# Patient Record
Sex: Male | Born: 1960
Health system: Southern US, Community
[De-identification: ages and names within clinical notes are randomized; demographics above are authoritative.]

## PROBLEM LIST (undated history)

## (undated) DIAGNOSIS — I639 Cerebral infarction, unspecified: Secondary | ICD-10-CM

## (undated) DIAGNOSIS — E785 Hyperlipidemia, unspecified: Secondary | ICD-10-CM

## (undated) DIAGNOSIS — K625 Hemorrhage of anus and rectum: Secondary | ICD-10-CM

## (undated) DIAGNOSIS — K449 Diaphragmatic hernia without obstruction or gangrene: Secondary | ICD-10-CM

## (undated) DIAGNOSIS — K2100 Gastro-esophageal reflux disease with esophagitis, without bleeding: Secondary | ICD-10-CM

## (undated) DIAGNOSIS — T7840XA Allergy, unspecified, initial encounter: Secondary | ICD-10-CM

## (undated) DIAGNOSIS — K635 Polyp of colon: Secondary | ICD-10-CM

## (undated) DIAGNOSIS — K219 Gastro-esophageal reflux disease without esophagitis: Secondary | ICD-10-CM

## (undated) DIAGNOSIS — K222 Esophageal obstruction: Secondary | ICD-10-CM

## (undated) HISTORY — PX: UPPER GASTROINTESTINAL ENDOSCOPY: SHX188

## (undated) HISTORY — DX: Polyp of colon: K63.5

## (undated) HISTORY — PX: OTHER SURGICAL HISTORY: SHX169

## (undated) HISTORY — DX: Gastro-esophageal reflux disease with esophagitis, without bleeding: K21.00

## (undated) HISTORY — DX: Gastro-esophageal reflux disease without esophagitis: K21.9

## (undated) HISTORY — PX: LIPOMA EXCISION: SHX5283

## (undated) HISTORY — DX: Hyperlipidemia, unspecified: E78.5

## (undated) HISTORY — DX: Allergy, unspecified, initial encounter: T78.40XA

## (undated) HISTORY — PX: POLYPECTOMY: SHX149

## (undated) HISTORY — DX: Esophageal obstruction: K22.2

## (undated) HISTORY — DX: Diaphragmatic hernia without obstruction or gangrene: K44.9

## (undated) HISTORY — PX: COLONOSCOPY: SHX174

## (undated) HISTORY — DX: Gastro-esophageal reflux disease with esophagitis: K21.0

## (undated) HISTORY — DX: Hemorrhage of anus and rectum: K62.5

---

## 2001-08-01 ENCOUNTER — Encounter (INDEPENDENT_AMBULATORY_CARE_PROVIDER_SITE_OTHER): Payer: Self-pay | Admitting: *Deleted

## 2001-08-01 ENCOUNTER — Ambulatory Visit (HOSPITAL_BASED_OUTPATIENT_CLINIC_OR_DEPARTMENT_OTHER): Admission: RE | Admit: 2001-08-01 | Discharge: 2001-08-01 | Payer: Self-pay | Admitting: General Surgery

## 2004-08-03 ENCOUNTER — Ambulatory Visit: Payer: Self-pay | Admitting: Internal Medicine

## 2005-01-19 ENCOUNTER — Ambulatory Visit: Payer: Self-pay | Admitting: Internal Medicine

## 2005-02-10 ENCOUNTER — Ambulatory Visit: Payer: Self-pay | Admitting: Internal Medicine

## 2005-04-12 ENCOUNTER — Encounter: Payer: Self-pay | Admitting: Internal Medicine

## 2005-07-18 ENCOUNTER — Ambulatory Visit: Payer: Self-pay | Admitting: Internal Medicine

## 2005-07-27 ENCOUNTER — Ambulatory Visit: Payer: Self-pay | Admitting: Internal Medicine

## 2006-06-07 ENCOUNTER — Ambulatory Visit: Payer: Self-pay | Admitting: Family Medicine

## 2007-06-24 ENCOUNTER — Ambulatory Visit: Payer: Self-pay | Admitting: Internal Medicine

## 2007-06-24 DIAGNOSIS — K625 Hemorrhage of anus and rectum: Secondary | ICD-10-CM | POA: Insufficient documentation

## 2007-06-28 ENCOUNTER — Telehealth (INDEPENDENT_AMBULATORY_CARE_PROVIDER_SITE_OTHER): Payer: Self-pay | Admitting: *Deleted

## 2007-07-05 ENCOUNTER — Encounter: Payer: Self-pay | Admitting: Internal Medicine

## 2007-07-30 ENCOUNTER — Encounter: Payer: Self-pay | Admitting: Internal Medicine

## 2007-08-05 ENCOUNTER — Ambulatory Visit: Payer: Self-pay | Admitting: Gastroenterology

## 2007-08-20 ENCOUNTER — Ambulatory Visit: Payer: Self-pay | Admitting: Gastroenterology

## 2007-08-20 ENCOUNTER — Encounter: Payer: Self-pay | Admitting: Internal Medicine

## 2007-08-20 ENCOUNTER — Encounter: Payer: Self-pay | Admitting: Gastroenterology

## 2007-08-20 DIAGNOSIS — D126 Benign neoplasm of colon, unspecified: Secondary | ICD-10-CM | POA: Insufficient documentation

## 2007-08-20 DIAGNOSIS — K449 Diaphragmatic hernia without obstruction or gangrene: Secondary | ICD-10-CM | POA: Insufficient documentation

## 2007-08-20 DIAGNOSIS — K222 Esophageal obstruction: Secondary | ICD-10-CM | POA: Insufficient documentation

## 2007-09-18 ENCOUNTER — Ambulatory Visit: Payer: Self-pay | Admitting: Gastroenterology

## 2007-10-21 ENCOUNTER — Telehealth (INDEPENDENT_AMBULATORY_CARE_PROVIDER_SITE_OTHER): Payer: Self-pay | Admitting: *Deleted

## 2007-10-25 DIAGNOSIS — K219 Gastro-esophageal reflux disease without esophagitis: Secondary | ICD-10-CM | POA: Insufficient documentation

## 2007-10-29 ENCOUNTER — Ambulatory Visit: Payer: Self-pay | Admitting: Internal Medicine

## 2008-07-24 ENCOUNTER — Telehealth (INDEPENDENT_AMBULATORY_CARE_PROVIDER_SITE_OTHER): Payer: Self-pay | Admitting: *Deleted

## 2008-07-31 ENCOUNTER — Encounter (INDEPENDENT_AMBULATORY_CARE_PROVIDER_SITE_OTHER): Payer: Self-pay | Admitting: *Deleted

## 2008-08-14 ENCOUNTER — Encounter: Payer: Self-pay | Admitting: Internal Medicine

## 2008-09-14 ENCOUNTER — Telehealth (INDEPENDENT_AMBULATORY_CARE_PROVIDER_SITE_OTHER): Payer: Self-pay | Admitting: *Deleted

## 2008-09-17 ENCOUNTER — Encounter: Payer: Self-pay | Admitting: Internal Medicine

## 2008-09-17 ENCOUNTER — Telehealth (INDEPENDENT_AMBULATORY_CARE_PROVIDER_SITE_OTHER): Payer: Self-pay | Admitting: *Deleted

## 2009-03-30 ENCOUNTER — Telehealth (INDEPENDENT_AMBULATORY_CARE_PROVIDER_SITE_OTHER): Payer: Self-pay | Admitting: *Deleted

## 2009-03-30 ENCOUNTER — Telehealth: Payer: Self-pay | Admitting: Internal Medicine

## 2009-05-19 ENCOUNTER — Telehealth (INDEPENDENT_AMBULATORY_CARE_PROVIDER_SITE_OTHER): Payer: Self-pay | Admitting: *Deleted

## 2009-11-11 ENCOUNTER — Ambulatory Visit: Payer: Self-pay | Admitting: Internal Medicine

## 2009-11-11 DIAGNOSIS — K589 Irritable bowel syndrome without diarrhea: Secondary | ICD-10-CM | POA: Insufficient documentation

## 2010-01-04 ENCOUNTER — Telehealth: Payer: Self-pay | Admitting: Gastroenterology

## 2010-10-11 NOTE — Progress Notes (Signed)
Summary: Schedule recall REV   Phone Note Outgoing Call Call back at [facility] Phone [phone]   Call placed by: Christie Nottingham CMA Duncan Dull),  January 04, 2010 11:17 AM Call placed to: Patient Summary of Call: Called pt to schedule recall REV. Pt states he cannot schedule at this time and would like to call back.  Initial call taken by: Christie Nottingham CMA Duncan Dull),  January 04, 2010 11:18 AM

## 2010-10-11 NOTE — Assessment & Plan Note (Signed)
Summary: for pain in abd//ph   Vital Signs:  Patient profile:   50 year old male Weight:      154.8 pounds Temp:     98.2 degrees F oral Pulse rate:   72 / minute Resp:     15 per minute BP sitting:   124 / 82  (left arm) Cuff size:   large  Vitals Entered By: Shonna Chock (November 11, 2009 9:53 AM) CC: Abdominal pain/ side pain-? Acid Reflux, patient not good about taking acid reflux med on a regular. Patient has a fullness feeling in his stomach at times. Symptoms off/on x 1 week Comments REVIEWED MED LIST, PATIENT AGREED DOSE AND INSTRUCTION CORRECT    CC:  Abdominal pain/ side pain-? Acid Reflux and patient not good about taking acid reflux med on a regular. Patient has a fullness feeling in his stomach at times. Symptoms off/on x 1 week.  History of Present Illness:  Abdominal Pain      This is a 50 year old man who presents with Abdominal pain X < 7 days .  The patient denies nausea, vomiting, diarrhea, constipation, melena, hematochezia, anorexia, and hematemesis.  The location of the pain is diffuse, mainly R(U&LQ) > L & minimally in center.  The pain is described as intermittent and dull pressure or fullness.  The patient denies the following symptoms: fever, weight loss, dysuria, chest pain, jaundice, and dark urine.  PMH of GERD, not taking PPI.Trigger = stress @ work. Endo & colonoscopy 2008: HH,peptic stricture (S/P dilation) & adenomatous polyps(repeat colonoscopy 2013 as per Dr Ardell Isaacs notes).Some dysphagia with most meals. NSAIDS occasionally for headache. Minimal alcohol; coke 2-3 /week.  Allergies (verified): No Known Drug Allergies  Past History:  Past Medical History: ESOPHAGEAL STRICTURE (ICD-530.3), PMH of  HIATAL HERNIA (ICD-553.3) REFLUX ESOPHAGITIS (ICD-530.11) COLONIC POLYPS, ADENOMATOUS (ICD-211.3) HEMORRHAGE, RECTAL/ANAL (ICD-569.3),PMH of  Past Surgical History:  Colon polypectomy; Esophageal dilation 08/2007  Review of Systems General:  Denies  chills and sweats. ENT:  Complains of difficulty swallowing; denies hoarseness. Psych:  Complains of anxiety; denies easily angered, easily tearful, and irritability.  Physical Exam  General:  Thin but well-nourished,in no acute distress; alert,appropriate and cooperative throughout examination Eyes:  No corneal or conjunctival inflammation noted. No icterus. MINIMAL ptosis OD.Perrla. Mouth:  Oral mucosa and oropharynx without lesions or exudates.  Teeth in good repair. No pharyngeal erythema.   Lungs:  Normal respiratory effort, chest expands symmetrically. Lungs are clear to auscultation, no crackles or wheezes. Heart:  regular rhythm, no murmur, no gallop, no rub, no JVD, no HJR, and bradycardia.  S4 Abdomen:  Bowel sounds positive,abdomen soft and non-tender without masses, organomegaly or hernias noted. Skin:  Intact without suspicious lesions or rashes. No jaundice Cervical Nodes:  No lymphadenopathy noted Axillary Nodes:  No palpable lymphadenopathy Psych:  memory intact for recent and remote, normally interactive, and good eye contact.     Impression & Recommendations:  Problem # 1:  IBS (ICD-564.1)  Problem # 2:  GASTROESOPHAGEAL REFLUX DISEASE (ICD-530.81)  His updated medication list for this problem includes:    Clidinium-chlordiazepoxide 2.5-5 Mg Caps (Clidinium-chlordiazepoxide) .Marland Kitchen... 1 q 6-8 hrs as needed abdominal pressure  Problem # 3:  ESOPHAGEAL STRICTURE (ICD-530.3) PMH of  Problem # 4:  COLONIC POLYPS, ADENOMATOUS (ICD-211.3) Colonoscopy 2013  Complete Medication List: 1)  Cialis 20 Mg Tabs (Tadalafil) 2)  Benadryl  3)  Prilosec  .Marland Kitchen.. 1 qd 4)  Clidinium-chlordiazepoxide 2.5-5 Mg Caps (Clidinium-chlordiazepoxide) .Marland Kitchen.. 1 q  6-8 hrs as needed abdominal pressure  Patient Instructions: 1)  Complete stool cards. 2)  Avoid foods high in acid (tomatoes, citrus juices, spicy foods). Avoid eating within two hours of lying down or before exercising. Do not over eat;  try smaller more frequent meals. Elevate head of bed twelve inches when sleeping. Omeprazole 20 mg two times a day X 1 month then once daily as maintenance Prescriptions: CLIDINIUM-CHLORDIAZEPOXIDE 2.5-5 MG CAPS (CLIDINIUM-CHLORDIAZEPOXIDE) 1 q 6-8 hrs as needed abdominal pressure  #30 x 1   Entered and Authorized by:   Marga Melnick MD   Signed by:   Marga Melnick MD on 11/11/2009   Method used:   Print then Give to Patient   RxID:   [id number]

## 2011-01-09 ENCOUNTER — Telehealth: Payer: Self-pay | Admitting: *Deleted

## 2011-01-09 NOTE — Telephone Encounter (Signed)
Pt wife called says pt is experiencing hemorrhoids and was given rx for proctofoam in the past would like to have medication called to [facility] [address]

## 2011-01-09 NOTE — Telephone Encounter (Signed)
OK 

## 2011-01-10 MED ORDER — HYDROCORTISONE ACE-PRAMOXINE 1-1 % RE FOAM: 1.0000 | Freq: Two times a day (BID) | RECTAL | Status: AC

## 2011-01-10 NOTE — Telephone Encounter (Signed)
Left msg on voice mail rx sent to pharmacy

## 2011-01-24 NOTE — Assessment & Plan Note (Signed)
[facility]                         GASTROENTEROLOGY OFFICE NOTE   Jeffrey [name], Jeffrey [name]                     MRN:          [id number]  DATE:08/05/2007                            DOB:          04/10/1961    REASON FOR CONSULTATION:  Hematochezia and dysphagia.   HISTORY OF PRESENT ILLNESS:  Jeffrey [name] is a 50 year old white male  referred through the courtesy of Dr. Marga Melnick.  He relates  intermittent problems with small volume rectal bleeding associated with  bowel movements and wiping for several years.  He states he has been  diagnosed with hemorrhoids and a fissure in the past.  He has had no  change in bowel habits, change in stool caliber, constipation, diarrhea,  abdominal pain, or rectal pain.  In addition, he relates a history of  solid food dysphagia for many years.  He states he had a food impaction  and underwent an upper endoscopy by Dr. Dorena Cookey in 2000, as an  emergency.  He states he has had a difficult time swallowing solid foods  for many years.  He has problems with meats and rice.  He has dysphagia  approximately every 2 weeks.  He denies odynophagia, heartburn, and  weight loss.   FAMILY HISTORY:  Negative for colon cancer, colon polyps, or  inflammatory bowel disease.   PAST MEDICAL HISTORY:  1. Allergic rhinitis.  2. History of cellulitis.  3. Hemorrhoids.   CURRENT MEDICATIONS:  1. Benadryl p.r.n.  2. Cialis 20 mg p.r.n.   MEDICATION ALLERGIES:  None known.   SOCIAL HISTORY:  Per the handwritten form.   REVIEW OF SYSTEMS:  Per the handwritten form.   PHYSICAL EXAMINATION:  GENERAL:  Well-developed, well-nourished, white  male in no acute distress.  VITAL SIGNS:  Height 5 feet 8.5 inches, weight 158.8 pounds, blood  pressure is 100/62, pulse 68 and regular.  HEENT:  Anicteric sclerae.  Oropharynx clear.  CHEST:  Clear to auscultation bilaterally.  CARDIAC:  Regular rate and rhythm without murmurs  appreciated.  ABDOMEN:  Soft, nontender, nondistended.  Normoactive bowel sounds.  No  palpable organomegaly, masses, or hernias.  RECTAL:  Deferred to time of colonoscopy.  EXTREMITIES:  Without clubbing, cyanosis, or edema.  NEUROLOGIC:  Alert and oriented x3.  Grossly nonfocal.   ASSESSMENT/PLAN:  1. Intermittent small volume hematochezia, presumed benign anorectal      source such as a hemorrhoid.  Need to exclude colorectal neoplasm      and other disorders.  Risks, benefits, and alternatives to      colonoscopy with possible biopsy, possible polypectomy, and      possible destruction of internal hemorrhoids discussed with the      patient and he consents to proceed.  This will be scheduled      electively.  2. Solid food dysphagia.  I suspect he has an esophageal stricture.      Risks, benefits, and alternatives to upper endoscopy with possible      biopsy and possible dilation discussed with the patient and he      consents to proceed.  This will be  scheduled at the time of his      colonoscopy.     Venita Lick. Russella Dar, MD, [facility]  Electronically Signed    MTS/MedQ  DD: 08/05/2007  DT: 08/05/2007  Job #: [id number]   cc:   Titus Dubin. Alwyn Ren, MD,FACP,FCCP

## 2011-01-24 NOTE — Assessment & Plan Note (Signed)
[facility]                         GASTROENTEROLOGY OFFICE NOTE   TAREZ, BOWNS                     MRN:          [id number]  DATE:09/18/2007                            DOB:          [birth year]    RETURN OFFICE VISIT   This is a return office visit for GERD complicated by a peptic  stricture.  He has had complete resolution of dysphagia following  dilation.  He relates no recurrent rectal bleeding after treatment of  his hemorrhoids.  Two small adenomatous polyps were removed during  colonoscopy.  He has no gastrointestinal complaints.   CURRENT MEDICATIONS:  1. Omeprazole 40 mg p.o. q.a.m.  2. Benadryl p.r.n.   MEDICATION ALLERGIES:  None known.   PHYSICAL EXAMINATION:  No acute distress.  Weight 167 pounds, blood  pressure is 108/72, pulse 64 and regular.  CHEST:  Clear to auscultation bilaterally.  CARDIAC:  Regular rhythm without murmurs.  ABDOMEN:  Soft and nontender with normoactive bowel sounds.   ASSESSMENT AND PLAN:  1. Gastroesophageal reflux disease with a peptic stricture and erosive      esophagitis.  Dysphagia, resolved post dilatation.  Maintain a      proton pump inhibitor indefinitely along with antireflux measures.      Return office visit in 1 year.  2. Adenomatous colon polyps.  Recall colonoscopy recommended for      December, 2013.  3. Internal hemorrhoids.  Currently asymptomatic status post injection      sclerosis.     Venita Lick. Russella Dar, MD, [facility]  Electronically Signed    MTS/MedQ  DD: 09/18/2007  DT: 09/18/2007  Job #: [id number]   cc:   Titus Dubin. Alwyn Ren, MD,FACP,FCCP

## 2011-01-27 NOTE — Op Note (Signed)
[facility]  Patient:    Jeffrey [name], Jeffrey [name] Visit Number: [id number] MRN: [id number]          Service Type: Attending:  Angelia Mould. Derrell Lolling, M.D. Dictated by:   Angelia Mould. Derrell Lolling, M.D. Proc. Date: 08/01/01   CC:         Titus Dubin. Alwyn Ren, M.D. [facility]   Operative Report  PREOPERATIVE DIAGNOSIS:  A 2.5 cm diameter soft tissue mass of posterior scalp, suspect lipoma.  POSTOPERATIVE DIAGNOSIS:  A 2.5 cm diameter soft tissue mass of posterior scalp, suspect lipoma.  PROCEDURE:  Excision 2.5 cm soft tissue mass from posterior scalp.  SURGEON:  Angelia Mould. Derrell Lolling, M.D.  OPERATIVE INDICATION:  This is a 50 year old man who has a five-year history of a soft tissue mass of the posterior scalp.  This has been slowly enlarging. It has never been inflamed or tender.  Examination revealed a 2.5 cm soft tissue mass of the posterior scalp above the hairline almost in the midline. This is soft and consistent with a lipoma by texture.  The skin is healthy. He was brought to the operating room electively.  DESCRIPTION OF PROCEDURE:  The patient was brought to the minor procedure room at [facility] day surgery center and placed prone with lots of padding.  We using scissors clipped a little bit of hair around this area.  It was prepped and draped in a sterile fashion.  Xylocaine 1% with epinephrine was used as a local infiltration anesthetic.  A transverse incision was made directly overlying the soft tissue mass.  This incision was about 2.5 cm in diameter. Dissection was carried down through the dermis, and then we sharply dissected a large lipomatous-appearing mass out of the deep subcutaneous tissue.  This went all the way down to the fascia.  Diffuse oozing was controlled with pressure and with electrocautery and Surgicel gauze.  At the completion of the case, there was absolutely no bleeding whatsoever.  Skin was closed with six interrupted sutures of 3-0 nylon.   The wound was cleansed thoroughly and dried and Neosporin ointment placed.  The patient tolerated the procedure well and was taken to the waiting area in excellent condition.  Estimated blood loss was about 15 cc.  Complications:  None.  Sponge, needle, and instrument counts were correct. Dictated by:   Angelia Mould. Derrell Lolling, M.D. Attending:  Angelia Mould. Derrell Lolling, M.D. DD:  08/01/01 TD:  08/01/01 Job: [id number]

## 2011-03-23 ENCOUNTER — Encounter: Payer: Self-pay | Admitting: Internal Medicine

## 2011-03-24 ENCOUNTER — Ambulatory Visit (INDEPENDENT_AMBULATORY_CARE_PROVIDER_SITE_OTHER): Payer: Managed Care, Other (non HMO) | Admitting: Internal Medicine

## 2011-03-24 ENCOUNTER — Encounter: Payer: Self-pay | Admitting: Internal Medicine

## 2011-03-24 VITALS — BP 116/90 | HR 94 | Temp 97.6°F | Wt 150.8 lb

## 2011-03-24 DIAGNOSIS — R0781 Pleurodynia: Secondary | ICD-10-CM

## 2011-03-24 DIAGNOSIS — R079 Chest pain, unspecified: Secondary | ICD-10-CM

## 2011-03-24 DIAGNOSIS — K222 Esophageal obstruction: Secondary | ICD-10-CM

## 2011-03-24 DIAGNOSIS — K219 Gastro-esophageal reflux disease without esophagitis: Secondary | ICD-10-CM

## 2011-03-24 DIAGNOSIS — R1012 Left upper quadrant pain: Secondary | ICD-10-CM

## 2011-03-24 MED ORDER — OMEPRAZOLE 20 MG PO CPDR: 20.0000 mg | DELAYED_RELEASE_CAPSULE | Freq: Two times a day (BID) | ORAL | Status: DC

## 2011-03-24 NOTE — Progress Notes (Signed)
  Subjective:    Patient ID: Jeffrey [name], male    DOB: June 23, 1961, 50 y.o.   MRN: [id number]  HPI ABDOMINAL PAIN: Location: LUQ  Onset: 7/8 or 7/9    Radiation: no  Severity: up to 5 Quality: burning  Duration: constant  Better with: no relievers  Worse with: no exacerbating factors Symptoms Nausea/Vomiting: no  Diarrhea: loose stool  Constipation: no  Melena/BRBPR: no  Anorexia: no  Fever/Chills: no  Dysuria: no  Rash: no  Wt loss: no  EtOH use: no  NSAIDs/ASA: no   BMW:UXLKG polyps X 1, GERD with a past history of esophageal stricture. On one occasion he was treated for dysphagia; apparently a nasogastric tube was used to dislodge the food bolus.  He has not been using his omeprazole on a regular basis.  FH: CAD among paternal members; father HH       Review of Systems  He denies chest pain, palpitations, pleurisy, dyspnea, or hemoptysis.     Objective:   Physical Exam General appearance is one of good health and nourishment w/o distress. Thin & well nourished  Eyes: No conjunctival inflammation or scleral icterus is present.  Oral exam: Dental hygiene is good; lips and gums are healthy appearing.There is mild  oropharyngeal erythema of uvula   Heart:  Normal rate and regular rhythm. S1 and S2 normal without gallop, murmur, click, rub or other extra sounds  . S4   Lungs:Chest clear to auscultation; no wheezes, rhonchi,rales ,or rubs present.No increased work of breathing.  There is no pain to palpation or compression of the thorax   Abdomen: bowel sounds normal, soft and non-tender without masses, organomegaly or hernias noted.  No guarding or rebound   Skin:Warm & dry.  Intact without suspicious lesions or rashes ; no jaundice or tenting  Vascular: All pulses are intact and no bruits.  Extremities: He has no clubbing, cyanosis or edema. Homans sign is negative.  Lymphatic: No lymphadenopathy is noted about the head, neck, axilla, or inguinal areas.               Assessment & Plan:  #1 left upper quadrant pain, constant since 7/8 or 7/09. This is in the context of significant esophageal reflux disease with past history of dysphasia and esophageal stricture.  #2 family history of coronary disease. EKG is normal with no ischemic changes  Plan: Cardiac enzymes will be checked but this clinically would appear to be hiatal hernia or reflux.  He asked to take his protein pump inhibitor twice a day before breakfast and evening meal.

## 2011-03-24 NOTE — Patient Instructions (Signed)
Complete stool cards.The triggers for dyspepsia or "heart burn"  include stress; the "aspirin family" ; alcohol; peppermint; and caffeine (coffee, tea, cola, and chocolate). The aspirin family would include aspirin and the nonsteroidal agents such as ibuprofen &  Naproxen. Tylenol would not cause reflux. If having dyspepsia ; food & dink should be avoided for @ least 2 hors before going to bed.

## 2011-04-04 ENCOUNTER — Telehealth: Payer: Self-pay | Admitting: *Deleted

## 2011-04-14 ENCOUNTER — Telehealth: Payer: Self-pay | Admitting: *Deleted

## 2011-04-14 NOTE — Telephone Encounter (Signed)
Message copied by Leanne Lovely on Fri Apr 14, 2011  2:25 PM ------      Message from: Pecola Lawless      Created: Fri Apr 14, 2011  2:18 PM       Did he have labs ?      ----- Message -----         From: Pecola Lawless, MD         Sent: 04/01/2011   8:47 AM           To: Pecola Lawless, MD            Tests            ----- Message -----         From: Floydene Flock         Sent: 03/31/2011   4:35 PM           To: Pecola Lawless, MD, [facility]            The system shows these labs still need to be collected.  He is not on my log.[facility]land KitchenMar[facility] KitchenSolstas would have received the labs stat..They have nothing.Marland Kitchen He probably did not stop by the lab.      ----- Message -----         From: Pecola Lawless, MD         Sent: 03/30/2011  10:32 AM           To: Floydene Flock            The patient came the results of his cardiac enzymes  & other labs which were done last week

## 2011-04-14 NOTE — Telephone Encounter (Signed)
Left message for pt--- needs lab appt (labs are in the computer)

## 2011-04-17 NOTE — Telephone Encounter (Signed)
Left message on voicemail for patient to return call to schedule lab appointment CBCD,CKMB, Troponin, D-dimer 786.50/789.02

## 2011-04-18 ENCOUNTER — Other Ambulatory Visit (INDEPENDENT_AMBULATORY_CARE_PROVIDER_SITE_OTHER): Payer: Managed Care, Other (non HMO)

## 2011-04-18 DIAGNOSIS — R579 Shock, unspecified: Secondary | ICD-10-CM

## 2011-04-18 DIAGNOSIS — R1012 Left upper quadrant pain: Secondary | ICD-10-CM

## 2011-04-18 LAB — CBC WITH DIFFERENTIAL/PLATELET
Eosinophils Relative: 1.3 % (ref 0.0–5.0)
Lymphocytes Relative: 15.9 % (ref 12.0–46.0)
MCV: 94.4 fl (ref 78.0–100.0)
Monocytes Absolute: 0.4 10*3/uL (ref 0.1–1.0)
Monocytes Relative: 6.2 % (ref 3.0–12.0)
Neutrophils Relative %: 76 % (ref 43.0–77.0)
Platelets: 200 10*3/uL (ref 150.0–400.0)
RBC: 4.99 Mil/uL (ref 4.22–5.81)
WBC: 6.8 10*3/uL (ref 4.5–10.5)

## 2011-04-18 LAB — CREATININE KINASE MB: CK-MB: 1.9 ng/mL (ref 0.3–4.0)

## 2011-04-18 NOTE — Progress Notes (Signed)
Labs only

## 2011-04-19 LAB — TROPONIN I: Troponin I: <0.01 ng/mL (ref <0.06)

## 2011-04-19 NOTE — Telephone Encounter (Signed)
Labs only

## 2011-04-20 LAB — D-DIMER, QUANTITATIVE: D-Dimer, Quant: 0.28 ug/mL-FEU (ref 0.00–0.48)

## 2011-08-23 ENCOUNTER — Other Ambulatory Visit: Payer: Managed Care, Other (non HMO)

## 2011-08-24 ENCOUNTER — Encounter: Payer: Self-pay | Admitting: Internal Medicine

## 2011-08-24 ENCOUNTER — Ambulatory Visit (INDEPENDENT_AMBULATORY_CARE_PROVIDER_SITE_OTHER): Payer: Managed Care, Other (non HMO) | Admitting: Internal Medicine

## 2011-08-24 VITALS — BP 136/80 | HR 92 | Temp 97.8°F | Wt 164.4 lb

## 2011-08-24 DIAGNOSIS — J209 Acute bronchitis, unspecified: Secondary | ICD-10-CM

## 2011-08-24 MED ORDER — AZITHROMYCIN 250 MG PO TABS: ORAL_TABLET | ORAL | Status: DC

## 2011-08-24 MED ORDER — HYDROCODONE-HOMATROPINE 5-1.5 MG/5ML PO SYRP: 5.0000 mL | ORAL_SOLUTION | Freq: Four times a day (QID) | ORAL | Status: AC | PRN

## 2011-08-24 MED ORDER — FLUTICASONE-SALMETEROL 250-50 MCG/DOSE IN AEPB: INHALATION_SPRAY | RESPIRATORY_TRACT | Status: AC

## 2011-08-24 NOTE — Progress Notes (Signed)
  Subjective:    Patient ID: Jeffrey [name], male    DOB: 1961/08/12, 50 y.o.   MRN: [id number]  HPI Respiratory tract infection Onset/symptoms:9 days  ago as chills & then sweats with myalgias & arthralgias Exposures::wife similarly ill before his symptoms Progression of symptoms: cough as of 12/8 Treatments/response:Nyquil, NSAIDS, lozenges, Delsym with minimal benefit Present symptoms: Fever/chills/sweats:no Frontal headache:no Facial pain:no Nasal purulence:no Sore throat:no Dental pain:no Lymphadenopathy:no Wheezing/shortness of breath:no Cough/sputum/hemoptysis:dry cough Pleuritic pain:no Associated extrinsic/allergic symptoms:itchy eyes/ sneezing:no Past medical history: Seasonal allergies: yes/asthma:no Smoking history:no           Review of Systems     Objective:   Physical Exam General appearance is of good health and nourishment; no acute distress or increased work of breathing is present.  No  lymphadenopathy about the head, neck, or axilla noted.   Eyes: No conjunctival inflammation or lid edema is present.   Ears:  External ear exam shows no significant lesions or deformities.  Otoscopic examination reveals clear canals, tympanic membranes are intact bilaterally without bulging, retraction, inflammation or discharge.  Nose:  External nasal examination shows no deformity or inflammation. Nasal mucosa are pink and moist without lesions or exudates. No septal dislocation .No obstruction to airflow.   Oral exam: Dental hygiene is good; lips and gums are healthy appearing.There is minimal  oropharyngeal erythema ; no exudate noted.   Neck:    Supple with full range of motion without pain.   Heart:  Normal rate and regular rhythm. S1 and S2 normal without gallop, murmur, click, rub or other extra sounds.   Lungs: He has inspiratory pops and wheezes generally homogenously.  Low grade breath bronchospasm is suggested. He has a harsh, brassy, nonproductive  cough. No increased work of breathing.    Extremities:  No cyanosis, edema, or clubbing  noted . Spooning  of the nails are noted   Skin: Warm & dry w/o jaundice or tenting.          Assessment & Plan:  #1 bronchitis, acute with reactive airways disease. No past medical history of asthma. Antibiotics, cough suppression, and bronchodilators/inhaled steroid will be recommended.  Chest x-ray will be ordered to rule out "walking pneumonia".  His history suggests that he had a viral illness such as the flu beginning 9 days ago with the subsequent development of the bronchitis with reactive airway findings.

## 2011-08-24 NOTE — Patient Instructions (Signed)
Advair one inhalation every 12 hours; gargle and spit after use Order for x-rays entered into  the computer; these will be performed at [address] across from [facility]. No appointment is necessary.

## 2011-08-25 ENCOUNTER — Ambulatory Visit (HOSPITAL_COMMUNITY)
Admission: RE | Admit: 2011-08-25 | Discharge: 2011-08-25 | Disposition: A | Discharge status: Home / Self Care | Payer: Managed Care, Other (non HMO) | Source: Ambulatory Visit | Attending: Internal Medicine | Admitting: Internal Medicine

## 2011-08-25 ENCOUNTER — Telehealth: Payer: Self-pay | Admitting: Internal Medicine

## 2011-08-25 DIAGNOSIS — J209 Acute bronchitis, unspecified: Secondary | ICD-10-CM

## 2011-08-25 DIAGNOSIS — R059 Cough, unspecified: Secondary | ICD-10-CM | POA: Insufficient documentation

## 2011-08-25 DIAGNOSIS — R0602 Shortness of breath: Secondary | ICD-10-CM | POA: Insufficient documentation

## 2011-08-25 DIAGNOSIS — R05 Cough: Secondary | ICD-10-CM | POA: Insufficient documentation

## 2011-08-25 MED ORDER — AZITHROMYCIN 250 MG PO TABS: ORAL_TABLET | ORAL | Status: AC

## 2011-08-25 NOTE — Telephone Encounter (Signed)
RX sent to pharmacy, left message informing patient

## 2012-07-11 ENCOUNTER — Encounter: Payer: Self-pay | Admitting: Gastroenterology

## 2012-07-26 ENCOUNTER — Encounter: Payer: Self-pay | Admitting: Gastroenterology

## 2012-09-06 ENCOUNTER — Encounter: Payer: Self-pay | Admitting: Gastroenterology

## 2012-09-06 ENCOUNTER — Ambulatory Visit (AMBULATORY_SURGERY_CENTER): Payer: Self-pay | Admitting: *Deleted

## 2012-09-06 VITALS — Ht 68.5 in | Wt 161.6 lb

## 2012-09-06 DIAGNOSIS — Z1211 Encounter for screening for malignant neoplasm of colon: Secondary | ICD-10-CM

## 2012-09-06 MED ORDER — MOVIPREP 100 G PO SOLR: ORAL | Status: DC

## 2012-09-20 ENCOUNTER — Encounter: Payer: Self-pay | Admitting: Gastroenterology

## 2012-09-20 ENCOUNTER — Ambulatory Visit (AMBULATORY_SURGERY_CENTER): Payer: Managed Care, Other (non HMO) | Admitting: Gastroenterology

## 2012-09-20 VITALS — BP 98/66 | HR 55 | Temp 96.6°F | Resp 24 | Ht 68.5 in | Wt 161.0 lb

## 2012-09-20 DIAGNOSIS — D126 Benign neoplasm of colon, unspecified: Secondary | ICD-10-CM

## 2012-09-20 DIAGNOSIS — Z86010 Personal history of colonic polyps: Secondary | ICD-10-CM

## 2012-09-20 DIAGNOSIS — Z1211 Encounter for screening for malignant neoplasm of colon: Secondary | ICD-10-CM

## 2012-09-20 MED ORDER — SODIUM CHLORIDE 0.9 % IV SOLN: 500.0000 mL | INTRAVENOUS | Status: DC

## 2012-09-20 NOTE — Op Note (Signed)
[facility] [address] [place], [address]   COLONOSCOPY PROCEDURE REPORT  PATIENT: Jeffrey [name], Jeffrey [name]  MR#: [id number] BIRTHDATE: 1961/06/21 , 51  yrs. old GENDER: Male ENDOSCOPIST: Meryl Dare, MD, [facility] PROCEDURE DATE:  09/20/2012 PROCEDURE:   Colonoscopy with snare polypectomy and Colonoscopy with biopsy ASA CLASS:   Class II INDICATIONS:Patient's personal history of adenomatous colon polyps, 08/2007. MEDICATIONS: MAC sedation, administered by CRNA and propofol (Diprivan) 350mg  IV DESCRIPTION OF PROCEDURE:   After the risks benefits and alternatives of the procedure were thoroughly explained, informed consent was obtained.  A digital rectal exam revealed no abnormalities of the rectum.   The LB [id number]  endoscope was introduced through the anus and advanced to the cecum, which was identified by both the appendix and ileocecal valve. No adverse events experienced.   The quality of the prep was excellent, using MoviPrep  The instrument was then slowly withdrawn as the colon was fully examined.  COLON FINDINGS: 5 mm non bleeding angiodysplastic lesion was found at the cecum.   Two sessile polyps measuring 3-4 mm in size were found in the descending colon and sigmoid colon.  A polypectomy was performed with a cold forceps.  The resection was complete and the polyp tissue was completely retrieved.   A sessile polyp measuring 4 mm in size was found in the descending colon.  A polypectomy was performed with cold snare.  The resection was complete and the polyp tissue was completely retrieved.   The colon was otherwise normal.  There was no diverticulosis, inflammation, polyps or cancers unless previously stated.  Retroflexed views revealed moderate internal hemorrhoids. The time to cecum=1 minutes 37 seconds.  Withdrawal time=11 minutes 33 seconds.  The scope was withdrawn and the procedure completed.  COMPLICATIONS: There were no  complications.  ENDOSCOPIC IMPRESSION: 1.   5mm angiodysplastic lesion at the cecum 2.  Two sessile polyps measuring 3-4 mm in the descending and sigmoid colon; polypectomy performed with cold forceps 3.   Sessile polyp measuring 3 mm in the descending colon; polypectomy performed with cold snare 4.   Moderate internal hemorrhoids  RECOMMENDATIONS: 1.  Await pathology results 2.  Repeat Colonoscopy in 5 years.  eSigned:  Meryl Dare, MD, [facility] 09/20/2012 9:20 AM

## 2012-09-20 NOTE — Patient Instructions (Addendum)
YOU HAD AN ENDOSCOPIC PROCEDURE TODAY AT THE [facility]: Refer to the procedure report that was given to you for any specific questions about what was found during the examination.  If the procedure report does not answer your questions, please call your gastroenterologist to clarify.  If you requested that your care partner not be given the details of your procedure findings, then the procedure report has been included in a sealed envelope for you to review at your convenience later.  YOU SHOULD EXPECT: Some feelings of bloating in the abdomen. Passage of more gas than usual.  Walking can help get rid of the air that was put into your GI tract during the procedure and reduce the bloating. If you had a lower endoscopy (such as a colonoscopy or flexible sigmoidoscopy) you may notice spotting of blood in your stool or on the toilet paper. If you underwent a bowel prep for your procedure, then you may not have a normal bowel movement for a few days.  DIET: Your first meal following the procedure should be a light meal and then it is ok to progress to your normal diet.  A half-sandwich or bowl of soup is an example of a good first meal.  Heavy or fried foods are harder to digest and may make you feel nauseous or bloated.  Likewise meals heavy in dairy and vegetables can cause extra gas to form and this can also increase the bloating.  Drink plenty of fluids but you should avoid alcoholic beverages for 24 hours.  ACTIVITY: Your care partner should take you home directly after the procedure.  You should plan to take it easy, moving slowly for the rest of the day.  You can resume normal activity the day after the procedure however you should NOT DRIVE or use heavy machinery for 24 hours (because of the sedation medicines used during the test).    SYMPTOMS TO REPORT IMMEDIATELY: A gastroenterologist can be reached at any hour.  During normal business hours, 8:30 AM to 5:00 PM Monday through Friday,  call [phone].  After hours and on weekends, please call the GI answering service at [phone] who will take a message and have the physician on call contact you.   Following lower endoscopy (colonoscopy or flexible sigmoidoscopy):  Excessive amounts of blood in the stool  Significant tenderness or worsening of abdominal pains  Swelling of the abdomen that is new, acute  Fever of 100F or higher  FOLLOW UP: If any biopsies were taken you will be contacted by phone or by letter within the next 1-3 weeks.  Call your gastroenterologist if you have not heard about the biopsies in 3 weeks.  Our staff will call the home number listed on your records the next business day following your procedure to check on you and address any questions or concerns that you may have at that time regarding the information given to you following your procedure. This is a courtesy call and so if there is no answer at the home number and we have not heard from you through the emergency physician on call, we will assume that you have returned to your regular daily activities without incident.  SIGNATURES/CONFIDENTIALITY: You and/or your care partner have signed paperwork which will be entered into your electronic medical record.  These signatures attest to the fact that that the information above on your After Visit Summary has been reviewed and is understood.  Full responsibility of the confidentiality of this   discharge information lies with you and/or your care-partner  Hemorrhoids Hemorrhoids are enlarged (dilated) veins around the rectum. There are 2 types of hemorrhoids, and the type of hemorrhoid is determined by its location. Internal hemorrhoids occur in the veins just inside the rectum.They are usually not painful, but they may bleed.However, they may poke through to the outside and become irritated and painful. External hemorrhoids involve the veins outside the anus and can be felt as a painful swelling  or hard lump near the anus.They are often itchy and may crack and bleed. Sometimes clots will form in the veins. This makes them swollen and painful. These are called thrombosed hemorrhoids. CAUSES Causes of hemorrhoids include:  Pregnancy. This increases the pressure in the hemorrhoidal veins.  Constipation.  Straining to have a bowel movement.  Obesity.  Heavy lifting or other activity that caused you to strain. TREATMENT Most of the time hemorrhoids improve in 1 to 2 weeks. However, if symptoms do not seem to be getting better or if you have a lot of rectal bleeding, your caregiver may perform a procedure to help make the hemorrhoids get smaller or remove them completely.Possible treatments include:  Rubber band ligation. A rubber band is placed at the base of the hemorrhoid to cut off the circulation.  Sclerotherapy. A chemical is injected to shrink the hemorrhoid.  Infrared light therapy. Tools are used to burn the hemorrhoid.  Hemorrhoidectomy. This is surgical removal of the hemorrhoid. HOME CARE INSTRUCTIONS   Increase fiber in your diet. Ask your caregiver about using fiber supplements.  Drink enough water and fluids to keep your urine clear or pale yellow.  Exercise regularly.  Go to the bathroom when you have the urge to have a bowel movement. Do not wait.  Avoid straining to have bowel movements.  Keep the anal area dry and clean.  Only take over-the-counter or prescription medicines for pain, discomfort, or fever as directed by your caregiver. If your hemorrhoids are thrombosed:  Take warm sitz baths for 20 to 30 minutes, 3 to 4 times per day.  If the hemorrhoids are very tender and swollen, place ice packs on the area as tolerated. Using ice packs between sitz baths may be helpful. Fill a plastic bag with ice. Place a towel between the bag of ice and your skin.  Medicated creams and suppositories may be used or applied as directed.  Do not use a  donut-shaped pillow or sit on the toilet for long periods. This increases blood pooling and pain. SEEK MEDICAL CARE IF:   You have increasing pain and swelling that is not controlled with your medicine.  You have uncontrolled bleeding.  You have difficulty or you are unable to have a bowel movement.  You have pain or inflammation outside the area of the hemorrhoids.  You have chills or an oral temperature above 102 F (38.9 C). MAKE SURE YOU:   Understand these instructions.  Will watch your condition.  Will get help right away if you are not doing well or get worse. Document Released: 08/25/2000 Document Revised: 11/20/2011 Document Reviewed: 08/08/2010 [facility] Patient Information 2013 [place], [place].   Handout on polyps

## 2012-09-20 NOTE — Progress Notes (Signed)
Patient did not experience any of the following events: a burn prior to discharge; a fall within the facility; wrong site/side/patient/procedure/implant event; or a hospital transfer or hospital admission upon discharge from the facility. (G8907) Patient did not have preoperative order for IV antibiotic SSI prophylaxis. (G8918)  

## 2012-09-23 ENCOUNTER — Telehealth: Payer: Self-pay | Admitting: *Deleted

## 2012-09-23 NOTE — Telephone Encounter (Signed)
Left message on f/u call 

## 2012-09-24 ENCOUNTER — Encounter: Payer: Self-pay | Admitting: Gastroenterology

## 2012-10-02 ENCOUNTER — Encounter: Payer: Self-pay | Admitting: Internal Medicine

## 2012-10-02 ENCOUNTER — Ambulatory Visit (INDEPENDENT_AMBULATORY_CARE_PROVIDER_SITE_OTHER): Payer: Managed Care, Other (non HMO) | Admitting: Internal Medicine

## 2012-10-02 VITALS — BP 110/78 | HR 81 | Temp 97.9°F | Wt 162.0 lb

## 2012-10-02 DIAGNOSIS — M545 Low back pain, unspecified: Secondary | ICD-10-CM

## 2012-10-02 DIAGNOSIS — K219 Gastro-esophageal reflux disease without esophagitis: Secondary | ICD-10-CM

## 2012-10-02 LAB — POCT URINALYSIS DIPSTICK
Bilirubin, UA: NEGATIVE
Blood, UA: NEGATIVE
Ketones, UA: NEGATIVE
Protein, UA: NEGATIVE
pH, UA: 6

## 2012-10-02 MED ORDER — OMEPRAZOLE 20 MG PO CPDR: DELAYED_RELEASE_CAPSULE | ORAL | Status: DC

## 2012-10-02 MED ORDER — TRAMADOL HCL 50 MG PO TABS
50.0000 mg | ORAL_TABLET | Freq: Four times a day (QID) | ORAL | Status: DC | PRN
Start: 2012-10-02 — End: 2012-11-06

## 2012-10-02 MED ORDER — CYCLOBENZAPRINE HCL 5 MG PO TABS: ORAL_TABLET | ORAL | Status: DC

## 2012-10-02 NOTE — Patient Instructions (Addendum)
The best exercises for the low back include freestyle swimming, stretch aerobics, and yoga.  The triggers for dyspepsia or "heart burn"  include stress; the "aspirin family" ; alcohol; peppermint; and caffeine (coffee, tea, cola, and chocolate). The aspirin family would include aspirin and the nonsteroidal agents such as ibuprofen &  Naproxen. Tylenol would not cause reflux. If having dyspepsia ; food & drink should be avoided for @ least 2 hours before going to bed.   If you activate My Chart; the results can be released to you as soon as they populate from the lab. If you choose not to use this program; the labs have to be reviewed, copied & mailed   causing a delay in getting the results to you.

## 2012-10-02 NOTE — Progress Notes (Signed)
  Subjective:    Patient ID: Jeffrey [name], male    DOB: 1960-12-15, 52 y.o.   MRN: [id number]  HPI BACK PAIN: Onset: 09/27/12 Trigger/injury: no Location: LS  Quality: "shooting spasms"  Worse with: no factors  Better with: squatting  Radiation: no Treatment/response:NSAIDS, Valium of bro-in -law, Excedrin Back & Body w/o benefit. Heat helped temporarily PMH of trauma: no        Review of Systems  Constitutional: no fever, chills, sweats, change in weight  Musculoskeletal:no  muscle cramps or pain; no  joint stiffness, redness, or swelling Skin:no rash, color/temp change Neuro: no leg weakness; incontinence (stool/urine); numbness and tingling Heme:no lymphadenopathy; abnormal bruising or bleeding .  He has run out of his PPI. Has a history of esophageal dilation in 2008. He's had colon polyps removed 2008 & 2014. He has noted some rectal bleeding from hemorrhoids; he denies active dysphagia, severe reflux, or melena.       Objective:   Physical Exam Gen.: Healthy and well-nourished in appearance. Alert, appropriate and cooperative throughout exam.  Eyes: No corneal or conjunctival inflammation noted. No icterus Mouth: Oral mucosa and oropharynx reveal no lesions or exudates. Teeth in good repair. Neck: No deformities, masses, or tenderness noted. Range of motion normal. Lungs: Normal respiratory effort; chest expands symmetrically. Lungs are clear to auscultation without rales, wheezes, or increased work of breathing. Heart: Normal rate and rhythm. Normal S1 and S2. No gallop, click, or rub. S4 w/o murmur. Abdomen: Bowel sounds normal; abdomen soft and nontender. No masses, organomegaly or hernias noted.                                     Musculoskeletal/extremities: No deformity or scoliosis noted of  the thoracic or lumbar spine. No clubbing, cyanosis, edema, or significant extremity  deformity noted. Range of motion normal .Tone & strength  normal.Joints normal. Nail  health good. Able to lie down & sit up w/o help; BUT he exhibits the classic "low back crawl" when reclining on  and sitting up from  the examining table . Negative SLR bilaterally Vascular: Carotid, radial artery, dorsalis pedis and  posterior tibial pulses are full and equal. No bruits present. Neurologic: Alert and oriented x3. Deep tendon reflexes symmetrical and normal. Gait including heel & toe walking normal.        Skin: Intact without suspicious lesions or rashes. Lymph: No cervical, axillary lymphadenopathy present. Psych: Mood and affect are normal. Normally interactive                                                                                          Assessment & Plan:  #1 acute LBP #2 GERD Plan: See orders and recommendations

## 2012-10-04 ENCOUNTER — Telehealth: Payer: Self-pay | Admitting: Gastroenterology

## 2012-10-04 NOTE — Telephone Encounter (Signed)
Left message for patient to call back  

## 2012-10-08 NOTE — Telephone Encounter (Signed)
I reviewed all the results of the path and all questions are answered.  He will call back for any questions

## 2012-10-08 NOTE — Telephone Encounter (Signed)
Left message for patient to call back  

## 2012-10-19 ENCOUNTER — Emergency Department (HOSPITAL_COMMUNITY): Payer: Managed Care, Other (non HMO)

## 2012-10-19 ENCOUNTER — Emergency Department (HOSPITAL_COMMUNITY)
Admission: EM | Admit: 2012-10-19 | Discharge: 2012-10-19 | Disposition: A | Discharge status: Home / Self Care | Payer: Managed Care, Other (non HMO) | Attending: Emergency Medicine | Admitting: Emergency Medicine

## 2012-10-19 ENCOUNTER — Encounter (HOSPITAL_COMMUNITY): Payer: Self-pay | Admitting: *Deleted

## 2012-10-19 DIAGNOSIS — Z860100 Personal history of colon polyps, unspecified: Secondary | ICD-10-CM | POA: Insufficient documentation

## 2012-10-19 DIAGNOSIS — M545 Low back pain, unspecified: Secondary | ICD-10-CM | POA: Insufficient documentation

## 2012-10-19 DIAGNOSIS — K219 Gastro-esophageal reflux disease without esophagitis: Secondary | ICD-10-CM | POA: Insufficient documentation

## 2012-10-19 DIAGNOSIS — Z8679 Personal history of other diseases of the circulatory system: Secondary | ICD-10-CM | POA: Insufficient documentation

## 2012-10-19 DIAGNOSIS — Z8719 Personal history of other diseases of the digestive system: Secondary | ICD-10-CM | POA: Insufficient documentation

## 2012-10-19 DIAGNOSIS — R109 Unspecified abdominal pain: Secondary | ICD-10-CM

## 2012-10-19 DIAGNOSIS — Z79899 Other long term (current) drug therapy: Secondary | ICD-10-CM | POA: Insufficient documentation

## 2012-10-19 LAB — CBC WITH DIFFERENTIAL/PLATELET
Basophils Absolute: 0.1 10*3/uL (ref 0.0–0.1)
Basophils Relative: 1 % (ref 0–1)
Eosinophils Absolute: 0.5 10*3/uL (ref 0.0–0.7)
Eosinophils Relative: 10 % — ABNORMAL HIGH (ref 0–5)
HCT: 44.1 % (ref 39.0–52.0)
Hemoglobin: 16.2 g/dL (ref 13.0–17.0)
Lymphocytes Relative: 33 % (ref 12–46)
Lymphs Abs: 1.6 10*3/uL (ref 0.7–4.0)
MCH: 32.1 pg (ref 26.0–34.0)
MCHC: 36.7 g/dL — ABNORMAL HIGH (ref 30.0–36.0)
MCV: 87.5 fL (ref 78.0–100.0)
Monocytes Absolute: 0.5 10*3/uL (ref 0.1–1.0)
Monocytes Relative: 10 % (ref 3–12)
Neutro Abs: 2.3 10*3/uL (ref 1.7–7.7)
Neutrophils Relative %: 46 % (ref 43–77)
Platelets: 176 10*3/uL (ref 150–400)
RBC: 5.04 MIL/uL (ref 4.22–5.81)
RDW: 12.4 % (ref 11.5–15.5)
WBC: 5 10*3/uL (ref 4.0–10.5)

## 2012-10-19 LAB — URINALYSIS, ROUTINE W REFLEX MICROSCOPIC
Bilirubin Urine: NEGATIVE
Leukocytes, UA: NEGATIVE
Nitrite: NEGATIVE
Specific Gravity, Urine: 1.03 (ref 1.005–1.030)
Urobilinogen, UA: 1 mg/dL (ref 0.0–1.0)
pH: 6 (ref 5.0–8.0)

## 2012-10-19 LAB — COMPREHENSIVE METABOLIC PANEL
ALT: 15 U/L (ref 0–53)
AST: 19 U/L (ref 0–37)
Albumin: 4.1 g/dL (ref 3.5–5.2)
Alkaline Phosphatase: 64 U/L (ref 39–117)
BUN: 17 mg/dL (ref 6–23)
CO2: 30 mEq/L (ref 19–32)
Calcium: 9.4 mg/dL (ref 8.4–10.5)
Chloride: 102 mEq/L (ref 96–112)
Creatinine, Ser: 1.02 mg/dL (ref 0.50–1.35)
GFR calc Af Amer: >90 mL/min (ref >90)
GFR calc non Af Amer: 84 mL/min — ABNORMAL LOW (ref >90)
Glucose, Bld: 89 mg/dL (ref 70–99)
Potassium: 4.3 mEq/L (ref 3.5–5.1)
Sodium: 139 mEq/L (ref 135–145)
Total Bilirubin: 0.8 mg/dL (ref 0.3–1.2)
Total Protein: 7.3 g/dL (ref 6.0–8.3)

## 2012-10-19 LAB — LIPASE, BLOOD: Lipase: 139 U/L — ABNORMAL HIGH (ref 11–59)

## 2012-10-19 MED ORDER — ONDANSETRON HCL 4 MG PO TABS: 4.0000 mg | ORAL_TABLET | Freq: Four times a day (QID) | ORAL | Status: DC

## 2012-10-19 MED ORDER — HYDROMORPHONE HCL PF 1 MG/ML IJ SOLN
1.0000 mg | Freq: Once | INTRAMUSCULAR | Status: AC
  Administered 2012-10-19: 1 mg via INTRAVENOUS
  Filled 2012-10-19: qty 1

## 2012-10-19 MED ORDER — OXYCODONE-ACETAMINOPHEN 5-325 MG PO TABS: 1.0000 | ORAL_TABLET | Freq: Four times a day (QID) | ORAL | Status: DC | PRN

## 2012-10-19 MED ORDER — ONDANSETRON HCL 4 MG/2ML IJ SOLN
4.0000 mg | Freq: Once | INTRAMUSCULAR | Status: AC
  Administered 2012-10-19: 4 mg via INTRAVENOUS
  Filled 2012-10-19: qty 2

## 2012-10-19 NOTE — ED Provider Notes (Signed)
Medical screening examination/treatment/procedure(s) were performed by non-physician practitioner and as supervising physician I was immediately available for consultation/collaboration.   Ahana Najera, MD 10/19/12 2354 

## 2012-10-19 NOTE — ED Notes (Signed)
Pt returns from ct scan,  

## 2012-10-19 NOTE — ED Provider Notes (Signed)
History     CSN: [id number]  Arrival date & time 10/19/12  1204   First MD Initiated Contact with Patient 10/19/12 1318      Chief Complaint  Patient presents with  . Back Pain  . Groin Pain    (Consider location/radiation/quality/duration/timing/severity/associated sxs/prior treatment) Patient is a 52 y.o. male presenting with back pain and groin pain.  Back Pain Groin Pain   Patient is a 52 year old male with a history of GERD who comes in with a chief complaint of low back/groin pain.  The patient first noticed this pain 2-3 weeks ago of his right lower back. He went to go see his primary care physician who prescribed him pain medications and told him to do stretches.  Over the past two days he's noticed that the pain has intensified and is now radiating into his groin into his right testicle.  He is unable to sleep and has to sit up to alleviate the pain just a little bit.  Denies nausea, vomiting, constipation, diarrhea, hematuria, dysuria, hematochezia or rashes.  The pain is not changed with eating.  Had his first  Colonoscopy at age 73 which showed adenomas that were removed.  Just had a repeat colonoscopy and that showed some adenomas which were removed.   Past Medical History  Diagnosis Date  . Hiatal hernia   . Esophageal stricture   . Reflux esophagitis   . Colonic polyp     adenomatous  . Hemorrhage, anal or rectal   . GERD (gastroesophageal reflux disease)     Past Surgical History  Procedure Laterality Date  . Polyectomy  2008 & 2014  . Esophageal dilation  2008    Family History  Problem Relation Age of Onset  . Colon cancer Neg Hx   . Stomach cancer Neg Hx   . Esophageal cancer Neg Hx   . Rectal cancer Neg Hx     History  Substance Use Topics  . Smoking status: Never Smoker   . Smokeless tobacco: Current User    Types: Snuff  . Alcohol Use: Yes     Comment: occasionally      Review of Systems  Musculoskeletal: Positive for back pain.   All  other systems negative except as documented in the HPI. All pertinent positives and negatives as reviewed in the HPI.  Allergies  Review of patient's allergies indicates no known allergies.  Home Medications   Current Outpatient Rx  Name  Route  Sig  Dispense  Refill  . cyclobenzaprine (FLEXERIL) 5 MG tablet      1-2 qhs prn   10 tablet   0   . DiphenhydrAMINE HCl (BENADRYL ALLERGY PO)                . omeprazole (PRILOSEC) 20 MG capsule      30 minutes before breakfast   90 capsule   3   . traMADol (ULTRAM) 50 MG tablet   Oral   Take 1 tablet (50 mg total) by mouth every 6 (six) hours as needed for pain.   30 tablet   0     BP 125/75  Pulse 68  Temp(Src) 97.3 F (36.3 C) (Oral)  Resp 20  SpO2 97%  Physical Exam  Nursing note and vitals reviewed. Constitutional: He is oriented to person, place, and time. He appears well-developed and well-nourished.  HENT:  Head: Normocephalic and atraumatic.  Mouth/Throat: Oropharynx is clear and moist.  Eyes: Pupils are equal, round, and  reactive to light.  Neck: Normal range of motion. Neck supple.  Cardiovascular: Normal rate, regular rhythm and normal heart sounds.   Pulmonary/Chest: Effort normal and breath sounds normal.  Abdominal: Soft. Bowel sounds are normal. He exhibits no distension and no mass. There is no hepatosplenomegaly. There is tenderness in the right upper quadrant and right lower quadrant. There is positive Murphy's sign. There is no rebound and no guarding.  Neurological: He is alert and oriented to person, place, and time.  Skin: Skin is warm and dry. No rash noted.  Psychiatric: He has a normal mood and affect.    ED Course  Procedures (including critical care time)  Labs Reviewed  URINALYSIS, ROUTINE W REFLEX MICROSCOPIC   Patient has a negative CT scan, but does have an elevated lipase.  We will obtain an ultrasound for further evaluation.  Patient does have some upper abdominal pain  MDM           Carlyle Dolly, PA-C 10/19/12 1545

## 2012-10-19 NOTE — ED Notes (Signed)
Reports being seen last week at pcp for lower back pain and given meds. Yesterday pain became more severe and radiating around to right groin, only relief is to sit upright, denies any urinary symptoms. Ambulatory at triage.

## 2012-10-19 NOTE — ED Provider Notes (Signed)
Patient was handed off to me by [facility]. Patient is having right upper quadrant pain and is awaiting an abdominal ultrasound to rule out a creatinine, liver or gallbladder pathology.   To reevaluating the patient does not have a rigid abdomen.   abdominal ultrasound is negative for any acute abnormality. Will refer patient to GI and give pain and nausea medications.  Pt has been advised of the symptoms that warrant their return to the ED. Patient has voiced understanding and has agreed to follow-up with the PCP or specialist.   Dorthula Matas, PA 10/19/12 [phone]

## 2012-10-20 NOTE — ED Provider Notes (Signed)
Medical screening examination/treatment/procedure(s) were performed by non-physician practitioner and as supervising physician I was immediately available for consultation/collaboration.  Sakeena Teall T Kamica Florance, MD 10/20/12 1522 

## 2012-10-26 ENCOUNTER — Other Ambulatory Visit: Payer: Self-pay

## 2012-11-05 ENCOUNTER — Encounter: Payer: Self-pay | Admitting: Lab

## 2012-11-06 ENCOUNTER — Ambulatory Visit (INDEPENDENT_AMBULATORY_CARE_PROVIDER_SITE_OTHER): Payer: Managed Care, Other (non HMO) | Admitting: Internal Medicine

## 2012-11-06 ENCOUNTER — Encounter: Payer: Self-pay | Admitting: Internal Medicine

## 2012-11-06 VITALS — BP 122/80 | HR 89 | Temp 98.2°F | Wt 157.0 lb

## 2012-11-06 DIAGNOSIS — R52 Pain, unspecified: Secondary | ICD-10-CM

## 2012-11-06 DIAGNOSIS — R109 Unspecified abdominal pain: Secondary | ICD-10-CM

## 2012-11-06 NOTE — Patient Instructions (Addendum)
The best exercises for the low back include freestyle swimming, stretch aerobics, and yoga.Cybex & Nautilus rather than dead weights are better for the back. 

## 2012-11-06 NOTE — Progress Notes (Signed)
  Subjective:    Patient ID: Jeffrey [name], male    DOB: 02-15-1961, 52 y.o.   MRN: [id number]  HPI He was seen in emergency room 10/19/12 for right flank pain which radiated to the right inguinal area.  Hospital records labs EKG x-ray reports reviewed and discussed.   Urinalysis was negative as was CT of the abdomen and pelvis.  Pertinent positives included mild elevation of lipase which prompted evaluation with ultrasound; this was negative  Discharged on  pain medication.  Since discharge course stable with no recurrence.     Review of Systems Constitutional: No fever, chills, significant weight change Gastrointestinal: No heartburn,dysphagia, nausea and vomiting,abdominal pain, change in bowels, anorexia, diarrhea, significant constipation, rectal bleeding, melena or jaundice Genitourinary: No dysuria,hematuria, pyuria, dark urine or flank pain Musculoskeletal: No myalgias or muscle cramping, joint stiffness, joint swelling, joint color change, weakness Dermatologic: No rash, change in color or temperature of skin Neurologic: No tremor, gait disturbance,  numbness or tingling Hematologic/lymphatic: No bruising, lymphadenopathy,or  abnormal clotting      Objective:   Physical Exam  Gen.: Thin but healthy and well-nourished in appearance. Alert, appropriate and cooperative throughout exam.Appears younger than stated age   Neck: No deformities, masses, or tenderness noted. Range of motion normal Lungs: Normal respiratory effort; chest expands symmetrically. Lungs are clear to auscultation  @ bases without rales, wheezes, or increased work of breathing.  Abdomen: Bowel sounds normal; abdomen soft and nontender. No masses, organomegaly or hernias noted.                  Musculoskeletal/extremities: No deformity or scoliosis noted of  the thoracic or lumbar spine.  No clubbing, cyanosis, edema, or significant extremity  deformity noted. Range of motion normal .Tone & strength   Normal. Joints normal . Nail health good. Able to lie down & sit up w/o help. Negative SLR bilaterally Vascular:  dorsalis pedis and  posterior tibial pulses are full and equal.  Neurologic: Alert and oriented x3. Deep tendon reflexes symmetrical and normal.  Gait normal  including heel & toe walking .        Skin: Intact without suspicious lesions or rashes. Lymph: No cervical, axillary lymphadenopathy present. Psych: Mood and affect are normal. Normally interactive                                                                                        Assessment & Plan:  #1 acute right flank pain with radiation to the right inguinal area. Urinalysis and CT of the abdomen and pelvis were negative. He still could have passed a renal calculus. The rapid resolution mitigates against lumbar radiculopathy.  #2 elevated lipase which was a "red herring"  Plan: Back hygiene stressed. He should strain the urine should he have any right flank pain. Chiropractory or physical therapy should be considered for acute flank pain which persists. Additionally MRI if this is protracted in nature.

## 2013-02-10 ENCOUNTER — Emergency Department (HOSPITAL_COMMUNITY)
Admission: EM | Admit: 2013-02-10 | Discharge: 2013-02-10 | Disposition: A | Discharge status: Home / Self Care | Payer: Managed Care, Other (non HMO) | Source: Home / Self Care | Attending: Emergency Medicine | Admitting: Emergency Medicine

## 2013-02-10 ENCOUNTER — Telehealth: Payer: Self-pay

## 2013-02-10 ENCOUNTER — Encounter (HOSPITAL_COMMUNITY): Payer: Self-pay | Admitting: Emergency Medicine

## 2013-02-10 DIAGNOSIS — H9203 Otalgia, bilateral: Secondary | ICD-10-CM

## 2013-02-10 DIAGNOSIS — R071 Chest pain on breathing: Secondary | ICD-10-CM

## 2013-02-10 DIAGNOSIS — R0789 Other chest pain: Secondary | ICD-10-CM

## 2013-02-10 DIAGNOSIS — H9209 Otalgia, unspecified ear: Secondary | ICD-10-CM

## 2013-02-10 DIAGNOSIS — R42 Dizziness and giddiness: Secondary | ICD-10-CM

## 2013-02-10 MED ORDER — CETIRIZINE HCL 10 MG PO CAPS: ORAL_CAPSULE | ORAL | Status: DC

## 2013-02-10 MED ORDER — CYCLOBENZAPRINE HCL 10 MG PO TABS: 10.0000 mg | ORAL_TABLET | Freq: Two times a day (BID) | ORAL | Status: DC | PRN

## 2013-02-10 NOTE — ED Notes (Signed)
Pt c/o intermittent mild chest pain onset 1 week... Pain lasts >1 minute sxs also include: intermittent dizziness when he stands up or lays down Denies at the moment: HA, SOB, HA, blurry vision, edema He is alert and oriented w/no signs of acute distress.

## 2013-02-10 NOTE — ED Provider Notes (Signed)
History     CSN: [id number]  Arrival date & time 02/10/13  1650   First MD Initiated Contact with Patient 02/10/13 1709      Chief Complaint  Patient presents with  . Chest Pain    (Consider location/radiation/quality/duration/timing/severity/associated sxs/prior treatment) HPI Comments: Patient presents urgent care complaining that for about a week he's been having left-sided chest pains that last a time seconds to a couple minutes. Patient points to outer aspects of deltoid area. At times feels that lifting and certain movements exaggerates or exacerbates the pain and at times he feels a discomfort at rest. Patient denies any shortness of breath, nausea, or diaphoresis. He describes that he has called his physician's office and he was recommended to come here or the emergency department to be evaluated. Patient denies any shortness of breath or any chest pain at this moment as he's sitting in the exam table. Patient describes that the last time he felt this discomfort was about 3 hours ago. When he was brushing his teeth he suspects he recalls. Patient also describes that during the course of the week he was feeling some bilateral ear discomfort or itchiness, and suspected that maybe he was developing some type of your infection as he has been having  " allergy-type symptoms", which he describes as, nasal congestion cough runny nose.  Patient also describes that within the last few days he's been noticing that when he moves in his bed or when he moves or lays down he feels a bit of an intermittent sensation of dizziness or vertigo. Patient denies any further symptoms associated such as headaches, ringing in his ears are feeling nauseous.  Patient is a 52 y.o. male presenting with chest pain. The history is provided by the patient.  Chest Pain Pain location:  L chest Pain quality: aching   Pain quality: not burning, not crushing, no pressure and not shooting   Pain radiates to:  Does not  radiate Pain radiates to the back: no   Pain severity:  Mild Timing:  Intermittent Context: lifting, movement and at rest   Context: not breathing and no stress   Relieved by:  Rest Worsened by:  Nothing tried Ineffective treatments:  None tried Associated symptoms: dizziness   Associated symptoms: no altered mental status, no anorexia, no back pain, no cough, no diaphoresis, no dysphagia, no fatigue, no fever, no headache, no lower extremity edema, no nausea, no numbness, no palpitations, no shortness of breath, not vomiting and no weakness   Risk factors: no aortic disease, no diabetes mellitus, no hypertension, no immobilization, not obese, no prior DVT/PE and no surgery     Past Medical History  Diagnosis Date  . Hiatal hernia   . Esophageal stricture   . Reflux esophagitis   . Colonic polyp     adenomatous  . Hemorrhage, anal or rectal   . GERD (gastroesophageal reflux disease)     Past Surgical History  Procedure Laterality Date  . Polyectomy  2008 & 2014  . Esophageal dilation  2008    Family History  Problem Relation Age of Onset  . Colon cancer Neg Hx   . Stomach cancer Neg Hx   . Esophageal cancer Neg Hx   . Rectal cancer Neg Hx     History  Substance Use Topics  . Smoking status: Never Smoker   . Smokeless tobacco: Current User    Types: Snuff  . Alcohol Use: Yes     Comment: occasionally  Review of Systems  Constitutional: Positive for appetite change. Negative for fever, diaphoresis, activity change and fatigue.  HENT: Positive for ear pain and sore throat. Negative for trouble swallowing, neck pain, neck stiffness and voice change.   Respiratory: Negative for cough and shortness of breath.   Cardiovascular: Positive for chest pain. Negative for palpitations and leg swelling.  Gastrointestinal: Negative for nausea, vomiting, rectal pain and anorexia.  Musculoskeletal: Negative for back pain.  Skin: Negative for color change and pallor.    Neurological: Positive for dizziness. Negative for tremors, weakness, light-headedness, numbness and headaches.  Psychiatric/Behavioral: Negative for altered mental status.    Allergies  Review of patient's allergies indicates no known allergies.  Home Medications   Current Outpatient Rx  Name  Route  Sig  Dispense  Refill  . diphenhydrAMINE (BENADRYL) 25 mg capsule   Oral   Take 25 mg by mouth every 6 (six) hours as needed for itching. For allergies         . omeprazole (PRILOSEC) 20 MG capsule   Oral   Take 20 mg by mouth daily. 30 minutes before breakfast           BP 117/86  Pulse 96  Temp(Src) 98.7 F (37.1 C) (Oral)  Resp 16  SpO2 97%  Physical Exam  Nursing note and vitals reviewed. Constitutional: He appears well-developed and well-nourished. No distress.  HENT:  Right Ear: Hearing, tympanic membrane, external ear and ear canal normal.  Left Ear: Hearing, tympanic membrane, external ear and ear canal normal.  Pulmonary/Chest: Effort normal. Chest wall is not dull to percussion. He exhibits tenderness. He exhibits no mass, no bony tenderness, no laceration, no crepitus, no edema, no deformity, no swelling and no retraction.    Abdominal: Soft. He exhibits no distension. There is no tenderness. There is no rebound and no guarding.  Musculoskeletal: He exhibits no tenderness.  Neurological: He is alert.  Skin: No rash noted. No erythema.    ED Course  Procedures (including critical care time)  Labs Reviewed - No data to display No results found.   No diagnosis found.  EKG- normal sinus rhythm ventricular rate of 73 beats per minute. Normal PR intervals QRS duration. No QT prolongation. No ST or T wave inversion or depression to suggest acute ischemia. Her  MDM  Problem #1 Symptoms and exam, normal EKG, were consistent with musculoskeletal chest wall pain- deltoid/pectoralis. Have recommended patient to try a muscle relaxer for 2-3 days. We discussed  in detail what symptoms should warrant further evaluation in the emergency department for a coronary event. Based on his current symptoms and exam, at this point have a low level of suspicion of an acute or subacute event. Instructed patient to followup with primary care Dr. for further evaluation if he continues to feel this intermittent discomfort patient agrees and understands, current treatment plan and follow-up.   Problem #2 Bilateral mild otalgia with intermittent vertigo sensation Patient is normotensive non-orthostatic normal cardiovascular neurological exam. Have recommended patient to take a course of Zyrtec for 2 weeks and to followup with primary care Dr. if symptoms persist.        Jimmie Molly, MD 02/10/13 1746

## 2013-02-10 NOTE — Telephone Encounter (Signed)
I was informed by admin staff to f/u with patient, patient was transferred to [facility] 3 times and unable to get through. Spoke with patient, patient c/o of pain under his left arm x 5-6 days (last episode was 1 hour ago), when pain onsets it last about 1 minute or less. Patient also feels off balance and dizzy at times.   I discussed this over with Dr.Hopper and it was recommended that patient seek medical attention at the ER or Urgent Care. Patient verbalized understanding and indicated he would go to [facility] urgent care on [address].

## 2013-05-09 ENCOUNTER — Telehealth: Payer: Self-pay | Admitting: Internal Medicine

## 2013-05-09 NOTE — Telephone Encounter (Signed)
05/09/2013  Pt came by office, needs refill on Prilosec.  He states he does not use it every day so the 1 month supply last longer for him.  Asked if he needs to come in for visit before he can get a refill.  Please advise.  bw

## 2013-05-09 NOTE — Telephone Encounter (Signed)
Lm @ (10:37am) asking the pt to RTC regarding refill on med.//AB/CMA

## 2013-05-14 NOTE — Telephone Encounter (Signed)
Lm @ (11:10am) asking the pt to RTC regarding refill on med.//AB/CMA

## 2013-05-20 MED ORDER — OMEPRAZOLE 20 MG PO CPDR: 20.0000 mg | DELAYED_RELEASE_CAPSULE | Freq: Every day | ORAL | Status: DC

## 2013-05-20 NOTE — Telephone Encounter (Signed)
Rx sent to the pharmacy by e-script.  LM @ (4:51pm) informing the pt that refill was sent to the pharmacy. //AB/CMA

## 2013-07-17 ENCOUNTER — Other Ambulatory Visit: Payer: Self-pay

## 2014-05-06 ENCOUNTER — Ambulatory Visit (INDEPENDENT_AMBULATORY_CARE_PROVIDER_SITE_OTHER): Payer: Managed Care, Other (non HMO) | Admitting: Internal Medicine

## 2014-05-06 ENCOUNTER — Encounter: Payer: Self-pay | Admitting: Internal Medicine

## 2014-05-06 VITALS — BP 106/80 | HR 57 | Temp 98.0°F | Resp 12 | Wt 152.0 lb

## 2014-05-06 DIAGNOSIS — R590 Localized enlarged lymph nodes: Secondary | ICD-10-CM

## 2014-05-06 DIAGNOSIS — J029 Acute pharyngitis, unspecified: Secondary | ICD-10-CM

## 2014-05-06 DIAGNOSIS — R599 Enlarged lymph nodes, unspecified: Secondary | ICD-10-CM

## 2014-05-06 NOTE — Progress Notes (Signed)
   Subjective:    Patient ID: Jeffrey [name], male    DOB: [birth year], 53 y.o.   MRN: [id number]  HPI    Symptoms began one week ago as sore throat and swelling in the right submandibular area. The sore throat has essentially resolved and the right submandibular lymph node has decreased in size.  He had no other signs of rhinosinusitis. He had minor itchy, watery eyes, sneezing  He does have some extrinsic symptoms which are perennial. These have been aggravated by a pet dog in the home  He's also concerned about his moles and a skin tag of the right posterior thigh.    Review of Systems Frontal headache, facial pain , nasal purulence, dental pain, sore throat , otic pain or otic discharge denied. No fever , chills or sweats.     Objective:   Physical Exam  Pertinent positive findings include: There is a pea size submandibular lymph node at the right mid submandibular area. This is not pathologic. He also has osteomata of the mandible intraorally. He has a benign skin tag of the right posterior thigh. He has scattered nevi which have no malignant characteristics.  General appearance:good health ;well nourished; no acute distress or increased work of breathing is present.  No  lymphadenopathy about the head, neck, or axilla noted otherwise. Eyes: No conjunctival inflammation or lid edema is present. There is no scleral icterus. Ears:  External ear exam shows no significant lesions or deformities.  Otoscopic examination reveals clear canals, tympanic membranes are intact bilaterally without bulging, retraction, inflammation or discharge. Nose:  External nasal examination shows no deformity or inflammation. Nasal mucosa are pink and moist without lesions or exudates. No septal dislocation or deviation.No obstruction to airflow.  Oral exam: Dental hygiene is good; lips and gums are healthy appearing.There is no oropharyngeal erythema or exudate noted.  Neck:  No deformities,  thyromegaly, masses, or tenderness noted.   Supple with full range of motion without pain.  Heart:  Normal rate and regular rhythm. S1 and S2 normal without gallop, murmur, click, rub or other extra sounds.  Lungs:Chest clear to auscultation; no wheezes, rhonchi,rales ,or rubs present.No increased work of breathing.   Abdomen reveals no tenderness, organomegaly, or masses. Extremities:  No cyanosis, edema, or clubbing  noted  Skin: Warm & dry w/o jaundice or tenting.        Assessment & Plan:  #1 nonpathologic submandibular lymph node  #2 sore throat, resolved  #3 benign nevi and skin tag  Plan: See the after visit summary. He will monitor this submandibular lymph node weekly and report any enlargement or tenderness. ENT referral if progressive.

## 2014-05-06 NOTE — Progress Notes (Signed)
Pre visit review using our clinic review tool, if applicable. No additional management support is needed unless otherwise documented below in the visit note. 

## 2014-05-06 NOTE — Patient Instructions (Signed)
Zicam Melts or Zinc lozenges as per package label for sore throat . Complementary options include  vitamin C 2000 mg daily; & Echinacea for 4-7 days. Report persistent or progressive fever; discolored nasal or chest secretions; or frontal headache or facial  pain.  Plain Mucinex (NOT D) for thick secretions ;force NON dairy fluids .   Nasal cleansing in the shower as discussed with lather of mild shampoo.After 10 seconds wash off lather while  exhaling through nostrils. Make sure that all residual soap is removed to prevent irritation.  Flonase OR Nasacort AQ 1 spray in each nostril twice a day as needed. Use the "crossover" technique into opposite nostril spraying toward opposite ear @ 45 degree angle, not straight up into nostril.  Use a Neti pot daily only  as needed for significant sinus congestion; going from open side to congested side . Plain Allegra (NOT D )  160 daily , Loratidine 10 mg , OR Zyrtec 10 mg @ bedtime  as needed for itchy eyes & sneezing.

## 2016-05-08 ENCOUNTER — Ambulatory Visit: Payer: Managed Care, Other (non HMO) | Admitting: Internal Medicine

## 2016-06-29 NOTE — Progress Notes (Signed)
Subjective:    Patient ID: Jeffrey [name], male    DOB: 1961-04-03, 55 y.o.   MRN: [id number]  HPI He is here to establish with a new pcp.   He is here for a physical exam.    GERD:  He is not taking nay medication at this time.  He has mild GERD almost daily. He is a history of an esophageal stricture that was dilated once in 2008.  He has started to notice mild difficulty with swallowing with certain foods only. He is concerned that the stricture may be returning.  Moles:  He would like his moles checked.  He denies any changes.  He uses sunscreen.    LN under chin:  He has a persistent lymph node under his chin. He mentioned it to Dr. Linna Darner in the past. He does not think it has changed much. Sometimes it is smaller sometimes it is larger. He denies any other lumps in his neck or jaw area.    Medications and allergies reviewed with patient and updated if appropriate.  Patient Active Problem List   Diagnosis Date Noted  . IBS 11/11/2009  . GASTROESOPHAGEAL REFLUX DISEASE 10/25/2007  . COLONIC POLYPS, ADENOMATOUS 08/20/2007  . ESOPHAGEAL STRICTURE 08/20/2007  . HIATAL HERNIA 08/20/2007  . HEMORRHAGE, RECTAL/ANAL 06/24/2007    No current outpatient prescriptions on file prior to visit.   No current facility-administered medications on file prior to visit.     Past Medical History:  Diagnosis Date  . Colonic polyp    adenomatous  . Esophageal stricture   . GERD (gastroesophageal reflux disease)   . Hemorrhage, anal or rectal   . Hiatal hernia   . Reflux esophagitis     Past Surgical History:  Procedure Laterality Date  . ESOPHAGEAL DILATION  2008  . polyectomy  2008 & 2014    Social History   Social History  . Marital status: Married    Spouse name: N/A  . Number of children: N/A  . Years of education: N/A   Social History Main Topics  . Smoking status: Never Smoker  . Smokeless tobacco: Current User    Types: Snuff  . Alcohol use Yes     Comment:  occasionally  . Drug use: No  . Sexual activity: Not Asked   Other Topics Concern  . None   Social History Narrative   Umpires softball a lot      No regular exercise    Family History  Problem Relation Age of Onset  . Colon cancer Neg Hx   . Stomach cancer Neg Hx   . Esophageal cancer Neg Hx   . Rectal cancer Neg Hx     Review of Systems  Constitutional: Negative for chills and fever.  HENT: Positive for trouble swallowing (occasional - with certain foods).   Eyes: Negative for visual disturbance.  Respiratory: Negative for cough, shortness of breath and wheezing.   Cardiovascular: Negative for chest pain, palpitations and leg swelling.  Gastrointestinal: Negative for abdominal pain, blood in stool, constipation, diarrhea and nausea.  Genitourinary: Negative for difficulty urinating, dysuria and hematuria.  Musculoskeletal: Positive for arthralgias (left 4th MCP). Negative for back pain.  Neurological: Negative for light-headedness and headaches.  Psychiatric/Behavioral: Negative for dysphoric mood. The patient is not nervous/anxious.        Objective:   Vitals:   06/30/16 1101  BP: 132/88  Pulse: 84  Resp: 16  Temp: 97.8 F (36.6 C)   Filed Weights  06/30/16 1101  Weight: 161 lb (73 kg)   Body mass index is 23.78 kg/m.   Physical Exam Constitutional: He appears well-developed and well-nourished. No distress.  HENT:  Head: Normocephalic and atraumatic.  Right Ear: External ear normal.  Left Ear: External ear normal.  Mouth/Throat: Oropharynx is clear and moist.  Normal ear canals and TM b/l  Eyes: Conjunctivae and EOM are normal.  Neck: Neck supple. No tracheal deviation present. No thyromegaly present.  No carotid bruit  Cardiovascular: Normal rate, regular rhythm, normal heart sounds and intact distal pulses.   No murmur heard. Pulmonary/Chest: Effort normal and breath sounds normal. No respiratory distress. He has no wheezes. He has no rales.    Abdominal: Soft. Bowel sounds are normal. He exhibits no distension. There is no tenderness.  Genitourinary: Normal prostate size without nodules  Musculoskeletal: He exhibits no edema.  Lymphadenopathy:    He has no cervical adenopathy.  Skin: Skin is warm and dry. He is not diaphoretic.  Psychiatric: He has a normal mood and affect. His behavior is normal.         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

## 2016-06-30 ENCOUNTER — Ambulatory Visit (INDEPENDENT_AMBULATORY_CARE_PROVIDER_SITE_OTHER): Payer: Managed Care, Other (non HMO) | Admitting: Internal Medicine

## 2016-06-30 ENCOUNTER — Encounter: Payer: Self-pay | Admitting: Internal Medicine

## 2016-06-30 VITALS — BP 132/88 | HR 84 | Temp 97.8°F | Resp 16 | Ht 69.0 in | Wt 161.0 lb

## 2016-06-30 DIAGNOSIS — Z0000 Encounter for general adult medical examination without abnormal findings: Secondary | ICD-10-CM

## 2016-06-30 DIAGNOSIS — Z1159 Encounter for screening for other viral diseases: Secondary | ICD-10-CM | POA: Diagnosis not present

## 2016-06-30 DIAGNOSIS — Z23 Encounter for immunization: Secondary | ICD-10-CM

## 2016-06-30 DIAGNOSIS — K2100 Gastro-esophageal reflux disease with esophagitis, without bleeding: Secondary | ICD-10-CM

## 2016-06-30 DIAGNOSIS — K222 Esophageal obstruction: Secondary | ICD-10-CM | POA: Diagnosis not present

## 2016-06-30 MED ORDER — OMEPRAZOLE 20 MG PO CPDR: 20.0000 mg | DELAYED_RELEASE_CAPSULE | Freq: Every day | ORAL | 3 refills | Status: DC

## 2016-06-30 NOTE — Patient Instructions (Addendum)
  Test(s) ordered today. Your results will be released to [facility] (or called to you) after review, usually within 72hours after test completion. If any changes need to be made, you will be notified at that same time.  All other Health Maintenance issues reviewed.   All recommended immunizations and age-appropriate screenings are up-to-date or discussed.  Tetanus vaccine administered today.   Medications reviewed and updated.  Changes include restarting the omeprazole 20 mg daily  Your prescription(s) have been submitted to your pharmacy. Please take as directed and contact our office if you believe you are having problem(s) with the medication(s).

## 2016-06-30 NOTE — Progress Notes (Signed)
Pre visit review using our clinic review tool, if applicable. No additional management support is needed unless otherwise documented below in the visit note. 

## 2016-07-01 ENCOUNTER — Encounter: Payer: Self-pay | Admitting: Internal Medicine

## 2016-07-01 NOTE — Assessment & Plan Note (Signed)
Having daily symptoms Not currently taking his medication Restart omeprazole 20 mg daily, which did control his symptoms Discussed consequences of uncontrolled GERD

## 2016-07-01 NOTE — Assessment & Plan Note (Addendum)
He is having some mild difficulty swallowing at times-we will continue to monitor and follow up with GI if symptoms worsen Stressed the importance of taking his GERD medication daily

## 2016-07-03 ENCOUNTER — Other Ambulatory Visit (INDEPENDENT_AMBULATORY_CARE_PROVIDER_SITE_OTHER): Payer: Managed Care, Other (non HMO)

## 2016-07-03 DIAGNOSIS — Z1159 Encounter for screening for other viral diseases: Secondary | ICD-10-CM

## 2016-07-03 DIAGNOSIS — Z0000 Encounter for general adult medical examination without abnormal findings: Secondary | ICD-10-CM | POA: Diagnosis not present

## 2016-07-03 DIAGNOSIS — R7989 Other specified abnormal findings of blood chemistry: Secondary | ICD-10-CM

## 2016-07-03 LAB — COMPREHENSIVE METABOLIC PANEL
ALBUMIN: 4.1 g/dL (ref 3.5–5.2)
ALT: 15 U/L (ref 0–53)
AST: 18 U/L (ref 0–37)
Alkaline Phosphatase: 69 U/L (ref 39–117)
BUN: 14 mg/dL (ref 6–23)
CHLORIDE: 103 meq/L (ref 96–112)
CO2: 31 meq/L (ref 19–32)
Calcium: 9.2 mg/dL (ref 8.4–10.5)
Creatinine, Ser: 1.09 mg/dL (ref 0.40–1.50)
GFR: 74.8 mL/min (ref >60.00)
GLUCOSE: 90 mg/dL (ref 70–99)
POTASSIUM: 3.9 meq/L (ref 3.5–5.1)
SODIUM: 139 meq/L (ref 135–145)
Total Bilirubin: 0.6 mg/dL (ref 0.2–1.2)
Total Protein: 7.1 g/dL (ref 6.0–8.3)

## 2016-07-03 LAB — CBC WITH DIFFERENTIAL/PLATELET
BASOS PCT: 0.7 % (ref 0.0–3.0)
Basophils Absolute: 0 10*3/uL (ref 0.0–0.1)
EOS ABS: 0.4 10*3/uL (ref 0.0–0.7)
EOS PCT: 5.8 % — AB (ref 0.0–5.0)
HCT: 44.6 % (ref 39.0–52.0)
Hemoglobin: 15.8 g/dL (ref 13.0–17.0)
LYMPHS ABS: 1.5 10*3/uL (ref 0.7–4.0)
Lymphocytes Relative: 25.1 % (ref 12.0–46.0)
MCHC: 35.3 g/dL (ref 30.0–36.0)
MCV: 90.8 fl (ref 78.0–100.0)
MONO ABS: 0.5 10*3/uL (ref 0.1–1.0)
Monocytes Relative: 7.7 % (ref 3.0–12.0)
NEUTROS ABS: 3.7 10*3/uL (ref 1.4–7.7)
NEUTROS PCT: 60.7 % (ref 43.0–77.0)
PLATELETS: 225 10*3/uL (ref 150.0–400.0)
RBC: 4.91 Mil/uL (ref 4.22–5.81)
RDW: 13.1 % (ref 11.5–15.5)
WBC: 6.1 10*3/uL (ref 4.0–10.5)

## 2016-07-03 LAB — LIPID PANEL
CHOL/HDL RATIO: 4
CHOLESTEROL: 171 mg/dL (ref 0–200)
HDL: 38.3 mg/dL — ABNORMAL LOW (ref >39.00)
NONHDL: 132.5
Triglycerides: 215 mg/dL — ABNORMAL HIGH (ref 0.0–149.0)
VLDL: 43 mg/dL — AB (ref 0.0–40.0)

## 2016-07-03 LAB — HEPATITIS C ANTIBODY: HCV Ab: NEGATIVE

## 2016-07-03 LAB — LDL CHOLESTEROL, DIRECT: LDL DIRECT: 116 mg/dL

## 2016-07-03 LAB — TSH: TSH: 1.38 u[IU]/mL (ref 0.35–4.50)

## 2016-07-03 LAB — PSA: PSA: 0.4

## 2016-07-04 LAB — PSA, TOTAL AND FREE
PROSTATE SPECIFIC AG, SERUM: 0.4 ng/mL (ref 0.0–4.0)
PSA, Free Pct: 27.5 %
PSA, Free: 0.11 ng/mL

## 2016-07-05 ENCOUNTER — Encounter: Payer: Self-pay | Admitting: Internal Medicine

## 2016-07-07 ENCOUNTER — Encounter: Payer: Self-pay | Admitting: Internal Medicine

## 2016-08-11 ENCOUNTER — Other Ambulatory Visit: Payer: Self-pay | Admitting: Emergency Medicine

## 2016-08-11 MED ORDER — OMEPRAZOLE 20 MG PO CPDR: 20.0000 mg | DELAYED_RELEASE_CAPSULE | Freq: Every day | ORAL | 3 refills | Status: DC

## 2017-06-12 ENCOUNTER — Encounter: Payer: Self-pay | Admitting: Internal Medicine

## 2017-06-12 MED ORDER — SCOPOLAMINE 1 MG/3DAYS TD PT72
1.0000 | MEDICATED_PATCH | TRANSDERMAL | 0 refills | Status: DC
Start: 2017-06-12 — End: 2017-10-09

## 2017-08-31 ENCOUNTER — Encounter: Payer: Self-pay | Admitting: Gastroenterology

## 2017-09-07 ENCOUNTER — Other Ambulatory Visit: Payer: Self-pay | Admitting: Internal Medicine

## 2017-10-09 ENCOUNTER — Other Ambulatory Visit: Payer: Self-pay

## 2017-10-09 ENCOUNTER — Encounter: Payer: Self-pay | Admitting: Gastroenterology

## 2017-10-09 ENCOUNTER — Ambulatory Visit (AMBULATORY_SURGERY_CENTER): Payer: Self-pay

## 2017-10-09 VITALS — Ht 68.5 in | Wt 167.0 lb

## 2017-10-09 DIAGNOSIS — Z86010 Personal history of colonic polyps: Secondary | ICD-10-CM

## 2017-10-09 MED ORDER — NA SULFATE-K SULFATE-MG SULF 17.5-3.13-1.6 GM/177ML PO SOLN: 1.0000 | Freq: Once | ORAL | 0 refills | Status: AC

## 2017-10-09 NOTE — Progress Notes (Signed)
Denies allergies to eggs or soy products. Denies complication of anesthesia or sedation. Denies use of weight loss medication. Denies use of O2.   Emmi instructions declined.  

## 2017-10-23 ENCOUNTER — Encounter: Payer: Self-pay | Admitting: Gastroenterology

## 2017-10-23 ENCOUNTER — Other Ambulatory Visit: Payer: Self-pay

## 2017-10-23 ENCOUNTER — Ambulatory Visit (AMBULATORY_SURGERY_CENTER): Payer: Managed Care, Other (non HMO) | Admitting: Gastroenterology

## 2017-10-23 VITALS — BP 118/70 | HR 79 | Temp 97.5°F | Resp 16 | Ht 68.0 in | Wt 167.0 lb

## 2017-10-23 DIAGNOSIS — Z1211 Encounter for screening for malignant neoplasm of colon: Secondary | ICD-10-CM | POA: Insufficient documentation

## 2017-10-23 DIAGNOSIS — Z86010 Personal history of colonic polyps: Secondary | ICD-10-CM

## 2017-10-23 DIAGNOSIS — D124 Benign neoplasm of descending colon: Secondary | ICD-10-CM

## 2017-10-23 MED ORDER — SODIUM CHLORIDE 0.9 % IV SOLN: 500.0000 mL | Freq: Once | INTRAVENOUS | Status: DC

## 2017-10-23 NOTE — Progress Notes (Signed)
Report to PACU, RN, vss, BBS= Clear.  

## 2017-10-23 NOTE — Op Note (Signed)
[facility] Patient Name: Jeffrey [name] Procedure Date: 10/23/2017 10:03 AM MRN: [id number] Endoscopist: Ladene Artist , MD Age: 57 Referring MD:  Date of Birth: [birth year] Gender: Male Account #: [id number] Procedure:                Colonoscopy Indications:              Surveillance: Personal history of adenomatous                            polyps on last colonoscopy 5 years ago Medicines:                Monitored Anesthesia Care Procedure:                Pre-Anesthesia Assessment:                           - Prior to the procedure, a History and Physical                            was performed, and patient medications and                            allergies were reviewed. The patient's tolerance of                            previous anesthesia was also reviewed. The risks                            and benefits of the procedure and the sedation                            options and risks were discussed with the patient.                            All questions were answered, and informed consent                            was obtained. Prior Anticoagulants: The patient has                            taken no previous anticoagulant or antiplatelet                            agents. ASA Grade Assessment: II - A patient with                            mild systemic disease. After reviewing the risks                            and benefits, the patient was deemed in                            satisfactory condition to undergo the procedure.  After obtaining informed consent, the colonoscope                            was passed under direct vision. Throughout the                            procedure, the patient's blood pressure, pulse, and                            oxygen saturations were monitored continuously. The                            Colonoscope was introduced through the anus and                            advanced to the the  cecum, identified by                            appendiceal orifice and ileocecal valve. The                            ileocecal valve, appendiceal orifice, and rectum                            were photographed. The quality of the bowel                            preparation was excellent. The colonoscopy was                            performed without difficulty. The patient tolerated                            the procedure well. Scope In: 10:15:57 AM Scope Out: 10:28:03 AM Scope Withdrawal Time: 0 hours 10 minutes 17 seconds  Total Procedure Duration: 0 hours 12 minutes 6 seconds  Findings:                 The perianal and digital rectal examinations were                            normal.                           A single medium-sized localized angiodysplastic                            lesion without bleeding was found in the cecum.                           A 8 mm polyp was found in the descending colon. The                            polyp was sessile. The polyp was removed with a  cold snare. Resection and retrieval were complete.                           Internal hemorrhoids were found during                            retroflexion. The hemorrhoids were medium-sized and                            Grade I (internal hemorrhoids that do not prolapse).                           The exam was otherwise without abnormality on                            direct and retroflexion views. Complications:            No immediate complications. Estimated blood loss:                            None. Estimated Blood Loss:     Estimated blood loss: none. Impression:               - A single non-bleeding colonic angiodysplastic                            lesion.                           - One 8 mm polyp in the descending colon, removed                            with a cold snare. Resected and retrieved.                           - Internal hemorrhoids.                            - The examination was otherwise normal on direct                            and retroflexion views. Recommendation:           - Repeat colonoscopy in 5 years for surveillance.                           - Patient has a contact number available for                            emergencies. The signs and symptoms of potential                            delayed complications were discussed with the                            patient. Return to normal activities tomorrow.  Written discharge instructions were provided to the                            patient.                           - Resume previous diet.                           - Continue present medications.                           - Await pathology results. Ladene Artist, MD 10/23/2017 10:31:56 AM This report has been signed electronically.

## 2017-10-23 NOTE — Progress Notes (Signed)
Pt's states no medical or surgical changes since previsit or office visit. 

## 2017-10-23 NOTE — Patient Instructions (Signed)
YOU HAD AN ENDOSCOPIC PROCEDURE TODAY AT THE [facility]:   Refer to the procedure report that was given to you for any specific questions about what was found during the examination.  If the procedure report does not answer your questions, please call your gastroenterologist to clarify.  If you requested that your care partner not be given the details of your procedure findings, then the procedure report has been included in a sealed envelope for you to review at your convenience later.  YOU SHOULD EXPECT: Some feelings of bloating in the abdomen. Passage of more gas than usual.  Walking can help get rid of the air that was put into your GI tract during the procedure and reduce the bloating. If you had a lower endoscopy (such as a colonoscopy or flexible sigmoidoscopy) you may notice spotting of blood in your stool or on the toilet paper. If you underwent a bowel prep for your procedure, you may not have a normal bowel movement for a few days.  Please Note:  You might notice some irritation and congestion in your nose or some drainage.  This is from the oxygen used during your procedure.  There is no need for concern and it should clear up in a day or so.  SYMPTOMS TO REPORT IMMEDIATELY:   Following lower endoscopy (colonoscopy or flexible sigmoidoscopy):  Excessive amounts of blood in the stool  Significant tenderness or worsening of abdominal pains  Swelling of the abdomen that is new, acute  Fever of 100F or higher    For urgent or emergent issues, a gastroenterologist can be reached at any hour by calling [phone].   DIET:  We do recommend a small meal at first, but then you may proceed to your regular diet.  Drink plenty of fluids but you should avoid alcoholic beverages for 24 hours.  ACTIVITY:  You should plan to take it easy for the rest of today and you should NOT DRIVE or use heavy machinery until tomorrow (because of the sedation medicines used during the test).     FOLLOW UP: Our staff will call the number listed on your records the next business day following your procedure to check on you and address any questions or concerns that you may have regarding the information given to you following your procedure. If we do not reach you, we will leave a message.  However, if you are feeling well and you are not experiencing any problems, there is no need to return our call.  We will assume that you have returned to your regular daily activities without incident.  If any biopsies were taken you will be contacted by phone or by letter within the next 1-3 weeks.  Please call us at [phone] if you have not heard about the biopsies in 3 weeks.    SIGNATURES/CONFIDENTIALITY: You and/or your care partner have signed paperwork which will be entered into your electronic medical record.  These signatures attest to the fact that that the information above on your After Visit Summary has been reviewed and is understood.  Full responsibility of the confidentiality of this discharge information lies with you and/or your care-partner.   Resume medications. Information given on polyps and hemorrhoids. 

## 2017-10-23 NOTE — Progress Notes (Signed)
Called to room to assist during endoscopic procedure.  Patient ID and intended procedure confirmed with present staff. Received instructions for my participation in the procedure from the performing physician.  

## 2017-10-24 ENCOUNTER — Telehealth: Payer: Self-pay | Admitting: *Deleted

## 2017-10-24 ENCOUNTER — Telehealth: Payer: Self-pay

## 2017-10-24 NOTE — Telephone Encounter (Signed)
No answer second call back.  Left message.

## 2017-10-24 NOTE — Telephone Encounter (Signed)
Returning our call. Tells me he is "doing wonderful". Says thanks for the call to check up on him.

## 2017-10-24 NOTE — Telephone Encounter (Signed)
  Follow up Call-  Call back number 10/23/2017  Post procedure Call Back phone  # [phone]  Permission to leave phone message Yes  Some recent data might be hidden     Left message

## 2017-11-02 ENCOUNTER — Encounter: Payer: Self-pay | Admitting: Gastroenterology

## 2017-11-02 ENCOUNTER — Telehealth: Payer: Self-pay | Admitting: Gastroenterology

## 2017-11-02 NOTE — Telephone Encounter (Signed)
Patient notified that we will send out a letter ASAP.  I reviewed the pathology that he could see in MyChart and answered all questions.

## 2017-11-24 ENCOUNTER — Other Ambulatory Visit: Payer: Self-pay | Admitting: Internal Medicine

## 2017-12-07 ENCOUNTER — Telehealth: Payer: Self-pay | Admitting: Internal Medicine

## 2017-12-07 MED ORDER — OMEPRAZOLE 20 MG PO CPDR: 20.0000 mg | DELAYED_RELEASE_CAPSULE | Freq: Every day | ORAL | 0 refills | Status: DC

## 2017-12-07 NOTE — Telephone Encounter (Signed)
Per office policy sent 30 day to local pharmacy until appt.../lmb  

## 2017-12-07 NOTE — Telephone Encounter (Signed)
Copied from Christine [phone]. Topic: Quick Communication - See Telephone Encounter >> Dec 07, 2017  2:36 PM Jeffrey [name] wrote: CRM for notification. See Telephone encounter for: 12/07/17. Pt called in and made an appt for cpe 1st available in may. He would like to know if this med could be refilled until he can make it in?     omeprazole (PRILOSEC) 20 MG capsule [[id number]]

## 2018-01-06 ENCOUNTER — Other Ambulatory Visit: Payer: Self-pay | Admitting: Internal Medicine

## 2018-01-20 NOTE — Progress Notes (Signed)
Subjective:    Patient ID: Jeffrey [name], male    DOB: Nov 06, 1960, 57 y.o.   MRN: [id number]  HPI He is here for a physical exam.   GERD:  He is taking his medication daily as prescribed.  He denies any GERD symptoms and feels his GERD is well controlled.   He has a white spot on his gums and saw his dentist and he was told it was nothing.    He has a minor skin abnormality on his chest - he thinks it may be a skin tag.    The angles of his mouth are dry and irritated.    Medications and allergies reviewed with patient and updated if appropriate.  Patient Active Problem List   Diagnosis Date Noted  . GASTROESOPHAGEAL REFLUX DISEASE 10/25/2007  . COLONIC POLYPS, ADENOMATOUS 08/20/2007  . ESOPHAGEAL STRICTURE 08/20/2007  . HIATAL HERNIA 08/20/2007    Current Outpatient Medications on File Prior to Visit  Medication Sig Dispense Refill  . diphenhydrAMINE (BENADRYL) 25 mg capsule Take 25 mg by mouth every 6 (six) hours as needed.    . Docosanol (ABREVA) 10 % CREA Apply topically. Abreva, apply as needed for fever blisters.    Marland Kitchen ibuprofen (ADVIL,MOTRIN) 200 MG tablet Take 200 mg by mouth every 6 (six) hours as needed.    Marland Kitchen omeprazole (PRILOSEC) 20 MG capsule PLEASE SEE ATTACHED FOR DETAILED DIRECTIONS 30 capsule 0   No current facility-administered medications on file prior to visit.     Past Medical History:  Diagnosis Date  . Allergy   . Colonic polyp    adenomatous  . Esophageal stricture   . GERD (gastroesophageal reflux disease)   . Hemorrhage, anal or rectal   . Hiatal hernia   . Reflux esophagitis     Past Surgical History:  Procedure Laterality Date  . COLONOSCOPY    . ESOPHAGEAL DILATION  2008  . polyectomy  2008 & 2014  . POLYPECTOMY      Social History   Socioeconomic History  . Marital status: Married    Spouse name: Not on file  . Number of children: Not on file  . Years of education: Not on file  . Highest education level: Not on file    Occupational History  . Not on file  Social Needs  . Financial resource strain: Not on file  . Food insecurity:    Worry: Not on file    Inability: Not on file  . Transportation needs:    Medical: Not on file    Non-medical: Not on file  Tobacco Use  . Smoking status: Never Smoker  . Smokeless tobacco: Current User    Types: Snuff  Substance and Sexual Activity  . Alcohol use: Yes    Comment: occasionally  . Drug use: No  . Sexual activity: Not on file  Lifestyle  . Physical activity:    Days per week: Not on file    Minutes per session: Not on file  . Stress: Not on file  Relationships  . Social connections:    Talks on phone: Not on file    Gets together: Not on file    Attends religious service: Not on file    Active member of club or organization: Not on file    Attends meetings of clubs or organizations: Not on file    Relationship status: Not on file  Other Topics Concern  . Not on file  Social History Narrative   Um[employer]  softball a lot      No regular exercise    Family History  Problem Relation Age of Onset  . Colon cancer Neg Hx   . Stomach cancer Neg Hx   . Esophageal cancer Neg Hx   . Rectal cancer Neg Hx   . Liver cancer Neg Hx   . Pancreatic cancer Neg Hx     Review of Systems  Constitutional: Negative for chills and fever.  Eyes: Negative for visual disturbance.  Respiratory: Negative for cough, shortness of breath and wheezing.   Cardiovascular: Negative for chest pain, palpitations and leg swelling.  Gastrointestinal: Negative for abdominal pain, blood in stool, constipation, diarrhea and nausea.  Genitourinary: Negative for difficulty urinating, dysuria and hematuria.  Musculoskeletal: Positive for arthralgias (knee - mild) and back pain (mild, intermittent).  Skin: Negative for rash.       Skin tag, angles of mouth dryness  Neurological: Negative for light-headedness and headaches.  Psychiatric/Behavioral: Negative for dysphoric mood.  The patient is not nervous/anxious.        Objective:   Vitals:   01/21/18 1420  BP: 116/82  Pulse: (!) 101  Resp: 16  Temp: 98.1 F (36.7 C)  SpO2: 98%   Filed Weights   01/21/18 1420  Weight: 167 lb (75.8 kg)   Body mass index is 25.39 kg/m.  Wt Readings from Last 3 Encounters:  01/21/18 167 lb (75.8 kg)  10/23/17 167 lb (75.8 kg)  10/09/17 167 lb (75.8 kg)     Physical Exam Constitutional: He appears well-developed and well-nourished. No distress.  HENT:  Head: Normocephalic and atraumatic.  Right Ear: External ear normal.  Left Ear: External ear normal.  Mouth/Throat: Oropharynx is clear and moist.  Normal ear canals and TM b/l  Eyes: Conjunctivae and EOM are normal.  Neck: Neck supple. No tracheal deviation present. No thyromegaly present.  No carotid bruit  Cardiovascular: Normal rate, regular rhythm, normal heart sounds and intact distal pulses.   No murmur heard. Pulmonary/Chest: Effort normal and breath sounds normal. No respiratory distress. He has no wheezes. He has no rales.  Abdominal: Soft. He exhibits no distension. There is no tenderness.  Genitourinary: deferred  Musculoskeletal: He exhibits no edema.  Lymphadenopathy:   He has no cervical adenopathy.  Skin: Skin is warm and dry. He is not diaphoretic. Normal appearing mole on chest - possible early skin tag.  Mild dryness angles of mouth Psychiatric: He has a normal mood and affect. His behavior is normal.         Assessment & Plan:   Physical exam: Screening blood work  ordered Immunizations   Discussed shingrix,  Others up to date Colonoscopy   Up to date  Eye exams   Up to date  EKG  Last done 2014 Exercise  Umpires, no other exercise Weight  BMI is good Skin possible skin tag - no other concerns, saw derm  Substance abuse  none  See Problem List for Assessment and Plan of chronic medical problems.    FU in one year

## 2018-01-20 NOTE — Patient Instructions (Addendum)
Test(s) ordered today. Your results will be released to [facility] (or called to you) after review, usually within 72hours after test completion. If any changes need to be made, you will be notified at that same time.  All other Health Maintenance issues reviewed.   All recommended immunizations and age-appropriate screenings are up-to-date or discussed.  No immunizations administered today.   Medications reviewed and updated.  Use the prescribed cream at the corners of your mouth twice daily as needed.  Use vaseline as needed.    Your prescription(s) have been submitted to your pharmacy. Please take as directed and contact our office if you believe you are having problem(s) with the medication(s).   Please followup in one year    Health Maintenance, Male A healthy lifestyle and preventive care is important for your health and wellness. Ask your health care provider about what schedule of regular examinations is right for you. What should I know about weight and diet? Eat a Healthy Diet  Eat plenty of vegetables, fruits, whole grains, low-fat dairy products, and lean protein.  Do not eat a lot of foods high in solid fats, added sugars, or salt.  Maintain a Healthy Weight Regular exercise can help you achieve or maintain a healthy weight. You should:  Do at least 150 minutes of exercise each week. The exercise should increase your heart rate and make you sweat (moderate-intensity exercise).  Do strength-training exercises at least twice a week.  Watch Your Levels of Cholesterol and Blood Lipids  Have your blood tested for lipids and cholesterol every 5 years starting at 56 years of age. If you are at high risk for heart disease, you should start having your blood tested when you are 56 years old. You may need to have your cholesterol levels checked more often if: ? Your lipid or cholesterol levels are high. ? You are older than 56 years of age. ? You are at high risk for heart  disease.  What should I know about cancer screening? Many types of cancers can be detected early and may often be prevented. Lung Cancer  You should be screened every year for lung cancer if: ? You are a current smoker who has smoked for at least 30 years. ? You are a former smoker who has quit within the past 15 years.  Talk to your health care provider about your screening options, when you should start screening, and how often you should be screened.  Colorectal Cancer  Routine colorectal cancer screening usually begins at 56 years of age and should be repeated every 5-10 years until you are 56 years old. You may need to be screened more often if early forms of precancerous polyps or small growths are found. Your health care provider may recommend screening at an earlier age if you have risk factors for colon cancer.  Your health care provider may recommend using home test kits to check for hidden blood in the stool.  A small camera at the end of a tube can be used to examine your colon (sigmoidoscopy or colonoscopy). This checks for the earliest forms of colorectal cancer.  Prostate and Testicular Cancer  Depending on your age and overall health, your health care provider may do certain tests to screen for prostate and testicular cancer.  Talk to your health care provider about any symptoms or concerns you have about testicular or prostate cancer.  Skin Cancer  Check your skin from head to toe regularly.  Tell your health care  provider about any new moles or changes in moles, especially if: ? There is a change in a mole's size, shape, or color. ? You have a mole that is larger than a pencil eraser.  Always use sunscreen. Apply sunscreen liberally and repeat throughout the day.  Protect yourself by wearing long sleeves, pants, a wide-brimmed hat, and sunglasses when outside.  What should I know about heart disease, diabetes, and high blood pressure?  If you are 18-39 years  of age, have your blood pressure checked every 3-5 years. If you are 27 years of age or older, have your blood pressure checked every year. You should have your blood pressure measured twice-once when you are at a hospital or clinic, and once when you are not at a hospital or clinic. Record the average of the two measurements. To check your blood pressure when you are not at a hospital or clinic, you can use: ? An automated blood pressure machine at a pharmacy. ? A home blood pressure monitor.  Talk to your health care provider about your target blood pressure.  If you are between 23-101 years old, ask your health care provider if you should take aspirin to prevent heart disease.  Have regular diabetes screenings by checking your fasting blood sugar level. ? If you are at a normal weight and have a low risk for diabetes, have this test once every three years after the age of 61. ? If you are overweight and have a high risk for diabetes, consider being tested at a younger age or more often.  A one-time screening for abdominal aortic aneurysm (AAA) by ultrasound is recommended for men aged 26-75 years who are current or former smokers. What should I know about preventing infection? Hepatitis B If you have a higher risk for hepatitis B, you should be screened for this virus. Talk with your health care provider to find out if you are at risk for hepatitis B infection. Hepatitis C Blood testing is recommended for:  Everyone born from [birth year] through 1964.  Anyone with known risk factors for hepatitis C.  Sexually Transmitted Diseases (STDs)  You should be screened each year for STDs including gonorrhea and chlamydia if: ? You are sexually active and are younger than 56 years of age. ? You are older than 56 years of age and your health care provider tells you that you are at risk for this type of infection. ? Your sexual activity has changed since you were last screened and you are at an increased  risk for chlamydia or gonorrhea. Ask your health care provider if you are at risk.  Talk with your health care provider about whether you are at high risk of being infected with HIV. Your health care provider may recommend a prescription medicine to help prevent HIV infection.  What else can I do?  Schedule regular health, dental, and eye exams.  Stay current with your vaccines (immunizations).  Do not use any tobacco products, such as cigarettes, chewing tobacco, and e-cigarettes. If you need help quitting, ask your health care provider.  Limit alcohol intake to no more than 2 drinks per day. One drink equals 12 ounces of beer, 5 ounces of wine, or 1 ounces of hard liquor.  Do not use street drugs.  Do not share needles.  Ask your health care provider for help if you need support or information about quitting drugs.  Tell your health care provider if you often feel depressed.  Tell your health  care provider if you have ever been abused or do not feel safe at home. This information is not intended to replace advice given to you by your health care provider. Make sure you discuss any questions you have with your health care provider. Document Released: 02/24/2008 Document Revised: 04/26/2016 Document Reviewed: 06/01/2015 Elsevier Interactive Patient Education  [employer].

## 2018-01-21 ENCOUNTER — Encounter: Payer: Self-pay | Admitting: Internal Medicine

## 2018-01-21 ENCOUNTER — Ambulatory Visit (INDEPENDENT_AMBULATORY_CARE_PROVIDER_SITE_OTHER): Payer: Managed Care, Other (non HMO) | Admitting: Internal Medicine

## 2018-01-21 VITALS — BP 116/82 | HR 101 | Temp 98.1°F | Resp 16 | Ht 68.0 in | Wt 167.0 lb

## 2018-01-21 DIAGNOSIS — K130 Diseases of lips: Secondary | ICD-10-CM | POA: Insufficient documentation

## 2018-01-21 DIAGNOSIS — K2100 Gastro-esophageal reflux disease with esophagitis, without bleeding: Secondary | ICD-10-CM

## 2018-01-21 DIAGNOSIS — Z0000 Encounter for general adult medical examination without abnormal findings: Secondary | ICD-10-CM

## 2018-01-21 MED ORDER — OMEPRAZOLE 20 MG PO CPDR: DELAYED_RELEASE_CAPSULE | ORAL | 3 refills | Status: DC

## 2018-01-21 MED ORDER — CLOTRIMAZOLE-BETAMETHASONE 1-0.05 % EX CREA: 1.0000 "application " | TOPICAL_CREAM | Freq: Two times a day (BID) | CUTANEOUS | 0 refills | Status: DC

## 2018-01-21 NOTE — Assessment & Plan Note (Signed)
gerd controlled with daily medication - he can sometimes go a day without medication, but symptoms will come back No dysphagia Continue omeprazole 20 mg

## 2018-01-21 NOTE — Assessment & Plan Note (Signed)
Shaves every other day and does lick the sides of his mouth - dryness, possibly yeast infection Clotrimazole-betamethasone bid prn Use vaseline inbetween

## 2018-01-22 ENCOUNTER — Encounter: Payer: Self-pay | Admitting: Internal Medicine

## 2018-01-25 ENCOUNTER — Other Ambulatory Visit (INDEPENDENT_AMBULATORY_CARE_PROVIDER_SITE_OTHER): Payer: Managed Care, Other (non HMO)

## 2018-01-25 DIAGNOSIS — Z0000 Encounter for general adult medical examination without abnormal findings: Secondary | ICD-10-CM

## 2018-01-25 LAB — COMPREHENSIVE METABOLIC PANEL
ALK PHOS: 77 U/L (ref 39–117)
ALT: 19 U/L (ref 0–53)
AST: 20 U/L (ref 0–37)
Albumin: 4 g/dL (ref 3.5–5.2)
BUN: 15 mg/dL (ref 6–23)
CO2: 32 mEq/L (ref 19–32)
CREATININE: 1 mg/dL (ref 0.40–1.50)
Calcium: 9.1 mg/dL (ref 8.4–10.5)
Chloride: 101 mEq/L (ref 96–112)
GFR: 82.15 mL/min (ref >60.00)
GLUCOSE: 83 mg/dL (ref 70–99)
Potassium: 4.7 mEq/L (ref 3.5–5.1)
SODIUM: 139 meq/L (ref 135–145)
TOTAL PROTEIN: 7.4 g/dL (ref 6.0–8.3)
Total Bilirubin: 0.7 mg/dL (ref 0.2–1.2)

## 2018-01-25 LAB — CBC WITH DIFFERENTIAL/PLATELET
BASOS ABS: 0 10*3/uL (ref 0.0–0.1)
Basophils Relative: 0.4 % (ref 0.0–3.0)
Eosinophils Absolute: 0.4 10*3/uL (ref 0.0–0.7)
Eosinophils Relative: 7 % — ABNORMAL HIGH (ref 0.0–5.0)
HCT: 45.9 % (ref 39.0–52.0)
Hemoglobin: 16 g/dL (ref 13.0–17.0)
LYMPHS ABS: 1.9 10*3/uL (ref 0.7–4.0)
Lymphocytes Relative: 32.4 % (ref 12.0–46.0)
MCHC: 34.8 g/dL (ref 30.0–36.0)
MCV: 91.1 fl (ref 78.0–100.0)
MONO ABS: 0.7 10*3/uL (ref 0.1–1.0)
Monocytes Relative: 11.2 % (ref 3.0–12.0)
NEUTROS PCT: 49 % (ref 43.0–77.0)
Neutro Abs: 2.9 10*3/uL (ref 1.4–7.7)
PLATELETS: 229 10*3/uL (ref 150.0–400.0)
RBC: 5.04 Mil/uL (ref 4.22–5.81)
RDW: 13.2 % (ref 11.5–15.5)
WBC: 6 10*3/uL (ref 4.0–10.5)

## 2018-01-25 LAB — LIPID PANEL
Cholesterol: 158 mg/dL (ref 0–200)
HDL: 34.4 mg/dL — AB (ref >39.00)
LDL Cholesterol: 109 mg/dL — ABNORMAL HIGH (ref 0–99)
NONHDL: 124.07
Total CHOL/HDL Ratio: 5
Triglycerides: 77 mg/dL (ref 0.0–149.0)
VLDL: 15.4 mg/dL (ref 0.0–40.0)

## 2018-01-25 LAB — TSH: TSH: 2.28 u[IU]/mL (ref 0.35–4.50)

## 2018-01-28 ENCOUNTER — Encounter: Payer: Self-pay | Admitting: Internal Medicine

## 2018-01-28 LAB — PSA, TOTAL AND FREE
PSA, % Free: 33 % (calc) (ref >25)
PSA, FREE: 0.1 ng/mL
PSA, Total: 0.3 ng/mL (ref <=4.0)

## 2018-02-06 NOTE — Progress Notes (Signed)
Subjective:    Patient ID: Jeffrey [name], male    DOB: 1961/04/14, 57 y.o.   MRN: [id number]  HPI The patient is here for an acute visit.  He was at the beach for a week and returned home last weekend.  One week ago his bilateral lower arms were covered with blotches of red and were very itchy.  She put aloe vera on the arms, but it only helps for a very brief period of time.  He has been taking Benadryl, which has helped but he has had difficulty sleeping due to the itching.  Overall the rash has gotten better, but it is still very itchy.  He denies rash elsewhere.  He did apply sunscreen regularly.  He does not feel that he got sunburn.    Medications and allergies reviewed with patient and updated if appropriate.  Patient Active Problem List   Diagnosis Date Noted  . Angular cheilosis 01/21/2018  . GASTROESOPHAGEAL REFLUX DISEASE 10/25/2007  . COLONIC POLYPS, ADENOMATOUS 08/20/2007  . ESOPHAGEAL STRICTURE 08/20/2007  . HIATAL HERNIA 08/20/2007    Current Outpatient Medications on File Prior to Visit  Medication Sig Dispense Refill  . clotrimazole-betamethasone (LOTRISONE) cream Apply 1 application topically 2 (two) times daily. 30 g 0  . diphenhydrAMINE (BENADRYL) 25 mg capsule Take 25 mg by mouth every 6 (six) hours as needed.    . Docosanol (ABREVA) 10 % CREA Apply topically. Abreva, apply as needed for fever blisters.    Marland Kitchen ibuprofen (ADVIL,MOTRIN) 200 MG tablet Take 200 mg by mouth every 6 (six) hours as needed.    Marland Kitchen omeprazole (PRILOSEC) 20 MG capsule Take 20mg  daily 90 capsule 3   No current facility-administered medications on file prior to visit.     Past Medical History:  Diagnosis Date  . Allergy   . Colonic polyp    adenomatous  . Esophageal stricture   . GERD (gastroesophageal reflux disease)   . Hemorrhage, anal or rectal   . Hiatal hernia   . Reflux esophagitis     Past Surgical History:  Procedure Laterality Date  . COLONOSCOPY    . ESOPHAGEAL  DILATION  2008  . polyectomy  2008 & 2014  . POLYPECTOMY      Social History   Socioeconomic History  . Marital status: Married    Spouse name: Not on file  . Number of children: Not on file  . Years of education: Not on file  . Highest education level: Not on file  Occupational History  . Not on file  Social Needs  . Financial resource strain: Not on file  . Food insecurity:    Worry: Not on file    Inability: Not on file  . Transportation needs:    Medical: Not on file    Non-medical: Not on file  Tobacco Use  . Smoking status: Never Smoker  . Smokeless tobacco: Current User    Types: Snuff  Substance and Sexual Activity  . Alcohol use: Yes    Comment: occasionally  . Drug use: No  . Sexual activity: Not on file  Lifestyle  . Physical activity:    Days per week: Not on file    Minutes per session: Not on file  . Stress: Not on file  Relationships  . Social connections:    Talks on phone: Not on file    Gets together: Not on file    Attends religious service: Not on file    Active member of  club or organization: Not on file    Attends meetings of clubs or organizations: Not on file    Relationship status: Not on file  Other Topics Concern  . Not on file  Social History Narrative   Umpires softball a lot      No regular exercise    Family History  Problem Relation Age of Onset  . Colon cancer Neg Hx   . Stomach cancer Neg Hx   . Esophageal cancer Neg Hx   . Rectal cancer Neg Hx   . Liver cancer Neg Hx   . Pancreatic cancer Neg Hx     Review of Systems  Constitutional: Negative for chills and fever.  HENT: Negative for sore throat and trouble swallowing.   Skin: Positive for color change and rash. Negative for wound.       Objective:   Vitals:   02/07/18 1006  BP: 112/78  Pulse: 65  Resp: 16  Temp: 97.8 F (36.6 C)  SpO2: 98%   BP Readings from Last 3 Encounters:  02/07/18 112/78  01/21/18 116/82  10/23/17 118/70   Wt Readings from  Last 3 Encounters:  02/07/18 164 lb (74.4 kg)  01/21/18 167 lb (75.8 kg)  10/23/17 167 lb (75.8 kg)   Body mass index is 24.94 kg/m.   Physical Exam  Constitutional: He appears well-developed and well-nourished. No distress.  HENT:  Head: Normocephalic and atraumatic.  Musculoskeletal: He exhibits no edema.  Skin: Skin is warm and dry. Rash (b/l lower arms with erythema in patches, areas of excoriation, no scratch marks) noted. He is not diaphoretic.  No blisters, no open wounds        Assessment & Plan:    See Problem List for Assessment and Plan of chronic medical problems.

## 2018-02-07 ENCOUNTER — Ambulatory Visit (INDEPENDENT_AMBULATORY_CARE_PROVIDER_SITE_OTHER): Payer: Managed Care, Other (non HMO) | Admitting: Internal Medicine

## 2018-02-07 ENCOUNTER — Encounter: Payer: Self-pay | Admitting: Internal Medicine

## 2018-02-07 VITALS — BP 112/78 | HR 65 | Temp 97.8°F | Resp 16 | Wt 164.0 lb

## 2018-02-07 DIAGNOSIS — L578 Other skin changes due to chronic exposure to nonionizing radiation: Secondary | ICD-10-CM

## 2018-02-07 MED ORDER — METHYLPREDNISOLONE 4 MG PO TBPK: ORAL_TABLET | ORAL | 0 refills | Status: DC

## 2018-02-07 NOTE — Assessment & Plan Note (Signed)
Mild sun poisoning/ dermatitis on b/l lower arms Continue bendaryl, can take claritin or allegra during day Cool compresses Oral steroid - medrol dose pak given severity of itch  Call if no improvement  Discussed risk of recurrence with sun exposure

## 2018-02-07 NOTE — Patient Instructions (Addendum)
Medications reviewed and updated.  Changes include a few days of steroids.  Continue the bendaryl at night.  You can take allegra or claritin during the day.   You can continue the aloe, cool compresses on your arms.  Call if no improvement     Polymorphous Light Eruption Polymorphous light eruption is a skin reaction to rays of energy that are found in sunlight (ultraviolet radiation, or UV radiation). Tanning beds and sunlamps also use UV radiation. Polymorphous light eruption can cause itchy, red patches to develop on skin that is exposed to UV radiation. The patches usually appear immediately after or within a few hours of being in the sun or after using a tanning bed or sunlamp. They often go away on their own within several days or weeks. What are the causes? The cause of this condition is not known. What increases the risk? You are more likely to develop this condition if:  You are a woman.  You have light-colored skin (light complexion).  You live in a [place].  You have a family history of this condition.  What are the signs or symptoms? Symptoms of this condition include:  Small bumps on the skin.  Itchy patches on the skin.  Redness or scales on the skin.  A feeling of burning on the skin.  Swelling. This is rare.  Blisters. These are rare.  Chills, headache, nausea, and a general feeling of illness (malaise). These symptoms are rare.  How is this diagnosed? This condition may be diagnosed based on:  A physical exam.  Your medical history.  Phototests. These are tests to check your skin's reaction to a type of ultraviolet light (UVA light).  How is this treated? Polymorphous light eruption may clear up without treatment. Treatment for this condition may include:  Protecting your skin from the sun with clothing and sunscreen.  Avoiding sunlight between the hours of 11 a.m. and 2 p.m.  Applying medicines to the skin to reduce swelling  (topical corticosteroids).  Gradually spending more time in the sun to build resistance.  Oral immunosuppressive therapy, in rare cases. These are medicines taken by mouth that reduce the activity of the body's disease-fighting (immune) system.  Follow these instructions at home:  Take and apply over-the-counter and prescription medicines only as told by your health care provider.  Wear clothing to protect your skin from sunlight. This includes long sleeves and hats.  Use broad-spectrum sunscreens that have an SPF of 30 or higher. Broad-spectrum means that the sunscreen protects your skin from both types of UV light (UVA and UVB). Check product labels to make sure that the sunscreen protects against UVA and UVB light.  Follow instructions from your health care provider about being in the sun. You may need to: ? Avoid spending time in the sun between the hours of 11 a.m. and 2 p.m. UV radiation is strongest during those hours. ? Gradually spend more and more time in sunlight.  Keep all follow-up visits as told by your health care provider. This is important. Contact a health care provider if:  You have a fever.  You have severe pain, and medicines do not help. Get help right away if:  You have severe skin blistering, peeling, or bleeding.  You have a very high fever or chills.  You have a severe headache, you feel confused, or you faint.  You have nausea or vomiting.  You develop body aches and pains. Summary  Polymorphous light eruption is a skin  reaction to rays of energy found in sunlight (UV radiation). This condition can also be caused by UV radiation from tanning beds and sun lamps.  You may have itchy, red patches on your skin that go away on their own.  You may not need treatment. If you do need treatment, it usually includes protective clothing to shield your skin from the sun, broad-spectrum sunscreens with an SPF of 30 or higher, and medicines that you apply to your  skin. This information is not intended to replace advice given to you by your health care provider. Make sure you discuss any questions you have with your health care provider. Document Released: 11/24/2016 Document Revised: 11/24/2016 Document Reviewed: 11/24/2016 Elsevier Interactive Patient Education  [employer].

## 2018-02-15 ENCOUNTER — Telehealth: Payer: Self-pay

## 2018-02-15 NOTE — Telephone Encounter (Signed)
Copied from [place] [id number]. Topic: Appointment Scheduling - Scheduling Inquiry for Clinic >> Feb 15, 2018 10:48 AM Aurelio Brash B wrote: Reason for CRM:   PT wants to schedule an apt for shingrix vaccination  Left message advising patient that we are currently still on back order for shingrix vaccines, the wait is about 4 to 6 weeks---I have placed patient's name on a waitlist and I will call him when I have more vaccines in the office---can talk with Chelci Wintermute,RN at [place] office if any further questions

## 2018-04-02 ENCOUNTER — Telehealth: Payer: Self-pay

## 2018-04-02 NOTE — Telephone Encounter (Signed)
Patient needs to schedule nurse visit to get first shingrix vaccine, let tamara,RN at [place] office know when appt is made so that both vaccines can be labeled

## 2018-04-03 ENCOUNTER — Telehealth: Payer: Self-pay | Admitting: Internal Medicine

## 2018-04-03 NOTE — Telephone Encounter (Signed)
Both vaccines labeled and placed in refrig

## 2018-04-03 NOTE — Telephone Encounter (Signed)
Copied from [place] [id number]. Topic: General - Other >> Apr 03, 2018 12:23 PM Cecelia Byars, NT wrote: Reason for CRM: Patient called and has an appointment for the shingrix for 915 tomorrow 04/04/18 , please make Jonelle Sidle aware

## 2018-04-04 ENCOUNTER — Ambulatory Visit (INDEPENDENT_AMBULATORY_CARE_PROVIDER_SITE_OTHER): Payer: Managed Care, Other (non HMO)

## 2018-04-04 DIAGNOSIS — Z23 Encounter for immunization: Secondary | ICD-10-CM

## 2018-06-05 ENCOUNTER — Ambulatory Visit (INDEPENDENT_AMBULATORY_CARE_PROVIDER_SITE_OTHER): Payer: Managed Care, Other (non HMO)

## 2018-06-05 DIAGNOSIS — Z23 Encounter for immunization: Secondary | ICD-10-CM | POA: Diagnosis not present

## 2018-06-05 DIAGNOSIS — Z299 Encounter for prophylactic measures, unspecified: Secondary | ICD-10-CM

## 2018-07-23 ENCOUNTER — Ambulatory Visit (INDEPENDENT_AMBULATORY_CARE_PROVIDER_SITE_OTHER): Payer: Managed Care, Other (non HMO) | Admitting: Gastroenterology

## 2018-07-23 ENCOUNTER — Encounter: Payer: Self-pay | Admitting: Gastroenterology

## 2018-07-23 VITALS — BP 110/82 | HR 73 | Ht 68.0 in | Wt 164.0 lb

## 2018-07-23 DIAGNOSIS — Z86010 Personal history of colonic polyps: Secondary | ICD-10-CM | POA: Diagnosis not present

## 2018-07-23 DIAGNOSIS — K219 Gastro-esophageal reflux disease without esophagitis: Secondary | ICD-10-CM

## 2018-07-23 DIAGNOSIS — R1310 Dysphagia, unspecified: Secondary | ICD-10-CM

## 2018-07-23 NOTE — Patient Instructions (Signed)
You have been scheduled for a Barium Esophogram at [facility]iology (1st floor of the hospital) on 08/12/18 at 9:30am. Please arrive 15 minutes prior to your appointment for registration. Make certain not to have anything to eat or drink 3 hours prior to your test. If you need to reschedule for any reason, please contact radiology at [phone] to do so. __________________________________________________________________ A barium swallow is an examination that concentrates on views of the esophagus. This tends to be a double contrast exam (barium and two liquids which, when combined, create a gas to distend the wall of the oesophagus) or single contrast (non-ionic iodine based). The study is usually tailored to your symptoms so a good history is essential. Attention is paid during the study to the form, structure and configuration of the esophagus, looking for functional disorders (such as aspiration, dysphagia, achalasia, motility and reflux) EXAMINATION You may be asked to change into a gown, depending on the type of swallow being performed. A radiologist and radiographer will perform the procedure. The radiologist will advise you of the type of contrast selected for your procedure and direct you during the exam. You will be asked to stand, sit or lie in several different positions and to hold a small amount of fluid in your mouth before being asked to swallow while the imaging is performed .In some instances you may be asked to swallow barium coated marshmallows to assess the motility of a solid food bolus. The exam can be recorded as a digital or video fluoroscopy procedure. POST PROCEDURE It will take 1-2 days for the barium to pass through your system. To facilitate this, it is important, unless otherwise directed, to increase your fluids for the next 24-48hrs and to resume your normal diet.  This test typically takes about 30 minutes to  perform. _________________________________________________________________________  Jeffrey [name] have been scheduled for an endoscopy. Please follow written instructions given to you at your visit today. If you use inhalers (even only as needed), please bring them with you on the day of your procedure. Your physician has requested that you go to www.startemmi.com and enter the access code given to you at your visit today. This web site gives a general overview about your procedure. However, you should still follow specific instructions given to you by our office regarding your preparation for the procedure.  Thank you for choosing me and [facility].  Pricilla Riffle. Dagoberto Ligas., MD., [facility]

## 2018-07-23 NOTE — Progress Notes (Signed)
History of Present Illness: This is a 57 year old male who relates a 1 year history of intermittent solid food dysphagia.  Symptoms occur intermittently with meat, rice and bread.  He has had to self-induced vomiting on a few occasions to clear food impaction.  His reflux symptoms are generally controlled on Prilosec daily as needed. Denies weight loss, abdominal pain, constipation, diarrhea, change in stool caliber, melena, hematochezia, nausea, vomiting, chest pain.    No Known Allergies Outpatient Medications Prior to Visit  Medication Sig Dispense Refill  . clotrimazole-betamethasone (LOTRISONE) cream Apply 1 application topically 2 (two) times daily. 30 g 0  . diphenhydrAMINE (BENADRYL) 25 mg capsule Take 25 mg by mouth every 6 (six) hours as needed.    . Docosanol (ABREVA) 10 % CREA Apply topically. Abreva, apply as needed for fever blisters.    Marland Kitchen ibuprofen (ADVIL,MOTRIN) 200 MG tablet Take 200 mg by mouth every 6 (six) hours as needed.    . methylPREDNISolone (MEDROL DOSEPAK) 4 MG TBPK tablet 24 mg PO on day 1, then decr. by 4 mg/day x5 days 21 tablet 0  . omeprazole (PRILOSEC) 20 MG capsule Take 20mg  daily 90 capsule 3   No facility-administered medications prior to visit.    Past Medical History:  Diagnosis Date  . Allergy   . Colonic polyp    adenomatous  . Esophageal stricture   . GERD (gastroesophageal reflux disease)   . Hemorrhage, anal or rectal   . Hiatal hernia   . Reflux esophagitis    Past Surgical History:  Procedure Laterality Date  . COLONOSCOPY    . ESOPHAGEAL DILATION  2008  . polyectomy  2008 & 2014  . POLYPECTOMY     Social History   Socioeconomic History  . Marital status: Married    Spouse name: Not on file  . Number of children: Not on file  . Years of education: Not on file  . Highest education level: Not on file  Occupational History  . Not on file  Social Needs  . Financial resource strain: Not on file  . Food insecurity:    Worry:  Not on file    Inability: Not on file  . Transportation needs:    Medical: Not on file    Non-medical: Not on file  Tobacco Use  . Smoking status: Never Smoker  . Smokeless tobacco: Current User    Types: Snuff  Substance and Sexual Activity  . Alcohol use: Yes    Comment: occasionally  . Drug use: No  . Sexual activity: Not on file  Lifestyle  . Physical activity:    Days per week: Not on file    Minutes per session: Not on file  . Stress: Not on file  Relationships  . Social connections:    Talks on phone: Not on file    Gets together: Not on file    Attends religious service: Not on file    Active member of club or organization: Not on file    Attends meetings of clubs or organizations: Not on file    Relationship status: Not on file  Other Topics Concern  . Not on file  Social History Narrative   Umpires softball a lot      No regular exercise   Family History  Problem Relation Age of Onset  . Colon cancer Neg Hx   . Stomach cancer Neg Hx   . Esophageal cancer Neg Hx   . Rectal cancer Neg Hx   .  Liver cancer Neg Hx   . Pancreatic cancer Neg Hx       Physical Exam: General: Well developed, well nourished, no acute distress Head: Normocephalic and atraumatic Eyes:  sclerae anicteric, EOMI Ears: Normal auditory acuity Mouth: No deformity or lesions Lungs: Clear throughout to auscultation Heart: Regular rate and rhythm; no murmurs, rubs or bruits Abdomen: Soft, non tender and non distended. No masses, hepatosplenomegaly or hernias noted. Normal Bowel sounds Rectal: Not done Musculoskeletal: Symmetrical with no gross deformities  Pulses:  Normal pulses noted Extremities: No clubbing, cyanosis, edema or deformities noted Neurological: Alert oriented x 4, grossly nonfocal Psychological:  Alert and cooperative. Normal mood and affect   Assessment and Recommendations:  1. Dysphagia. GERD.  Suspected esophageal stricture.  Rule out esophagitis, less likely  neoplasm.  Omeprazole 20 mg daily (not prn).  Follow antireflux measures.  Schedule barium esophagram.  Schedule EGD with possible dilation. The risks (including bleeding, perforation, infection, missed lesions, medication reactions and possible hospitalization or surgery if complications occur), benefits, and alternatives to endoscopy with possible biopsy and possible dilation were discussed with the patient and they consent to proceed.   2.  Personal history of adenomatous colon polyps.  A 5-year interval surveillance colonoscopy is recommended in February 2024.

## 2018-08-12 ENCOUNTER — Ambulatory Visit (HOSPITAL_COMMUNITY)
Admission: RE | Admit: 2018-08-12 | Discharge: 2018-08-12 | Disposition: A | Discharge status: Home / Self Care | Payer: Managed Care, Other (non HMO) | Source: Ambulatory Visit | Attending: Gastroenterology | Admitting: Gastroenterology

## 2018-08-12 DIAGNOSIS — K219 Gastro-esophageal reflux disease without esophagitis: Secondary | ICD-10-CM | POA: Insufficient documentation

## 2018-08-12 DIAGNOSIS — R1310 Dysphagia, unspecified: Secondary | ICD-10-CM | POA: Insufficient documentation

## 2018-08-13 ENCOUNTER — Encounter: Payer: Self-pay | Admitting: Gastroenterology

## 2018-09-02 ENCOUNTER — Ambulatory Visit (AMBULATORY_SURGERY_CENTER): Payer: Managed Care, Other (non HMO) | Admitting: Gastroenterology

## 2018-09-02 ENCOUNTER — Encounter: Payer: Self-pay | Admitting: Gastroenterology

## 2018-09-02 VITALS — BP 115/76 | HR 82 | Temp 97.7°F | Resp 12 | Ht 68.0 in | Wt 164.0 lb

## 2018-09-02 DIAGNOSIS — R1310 Dysphagia, unspecified: Secondary | ICD-10-CM

## 2018-09-02 DIAGNOSIS — K222 Esophageal obstruction: Secondary | ICD-10-CM | POA: Diagnosis not present

## 2018-09-02 DIAGNOSIS — K2970 Gastritis, unspecified, without bleeding: Secondary | ICD-10-CM

## 2018-09-02 DIAGNOSIS — R933 Abnormal findings on diagnostic imaging of other parts of digestive tract: Secondary | ICD-10-CM

## 2018-09-02 DIAGNOSIS — K208 Other esophagitis: Secondary | ICD-10-CM | POA: Diagnosis not present

## 2018-09-02 MED ORDER — SODIUM CHLORIDE 0.9 % IV SOLN: 500.0000 mL | Freq: Once | INTRAVENOUS | Status: DC

## 2018-09-02 NOTE — Progress Notes (Signed)
Called to room to assist during endoscopic procedure.  Patient ID and intended procedure confirmed with present staff. Received instructions for my participation in the procedure from the performing physician.  

## 2018-09-02 NOTE — Progress Notes (Signed)
Report to PACU, RN, vss, BBS= Clear.  

## 2018-09-02 NOTE — Progress Notes (Signed)
Last use of snuff 09-01-18- none today

## 2018-09-02 NOTE — Op Note (Signed)
[facility] Patient Name: Jeffrey [name] Procedure Date: 09/02/2018 10:14 AM MRN: [id number] Endoscopist: Ladene Artist , MD Age: 57 Referring MD:  Date of Birth: 05-Apr-1961 Gender: Male Account #: [id number] Procedure:                Upper GI endoscopy Indications:              Dysphagia, Abnormal barium esophagram Medicines:                Monitored Anesthesia Care Procedure:                Pre-Anesthesia Assessment:                           - Prior to the procedure, a History and Physical                            was performed, and patient medications and                            allergies were reviewed. The patient's tolerance of                            previous anesthesia was also reviewed. The risks                            and benefits of the procedure and the sedation                            options and risks were discussed with the patient.                            All questions were answered, and informed consent                            was obtained. Prior Anticoagulants: The patient has                            taken no previous anticoagulant or antiplatelet                            agents. ASA Grade Assessment: II - A patient with                            mild systemic disease. After reviewing the risks                            and benefits, the patient was deemed in                            satisfactory condition to undergo the procedure.                           After obtaining informed consent, the endoscope was  passed under direct vision. Throughout the                            procedure, the patient's blood pressure, pulse, and                            oxygen saturations were monitored continuously. The                            Endoscope was introduced through the mouth, and                            advanced to the second part of duodenum. The upper                            GI endoscopy  was accomplished without difficulty.                            The patient tolerated the procedure well. Scope In: Scope Out: Findings:                 One benign-appearing, intrinsic mild stenosis was                            found 38 cm from the incisors. This stenosis                            measured 1.4 cm (inner diameter). The stenosis was                            traversed. A focal area of erythema was noted on                            the stricture. A guidewire was placed and the scope                            was withdrawn. Dilations were performed with Savary                            dilators with mild resistance at 15 mm and 16 mm.                            Biopsies were taken with a cold forceps for                            histology.                           The exam of the esophagus was otherwise normal.                           The entire examined stomach was normal.  The duodenal bulb and second portion of the                            duodenum were normal. Complications:            No immediate complications. Estimated Blood Loss:     Estimated blood loss was minimal. Impression:               - Benign-appearing esophageal stenosis. Dilated.                            Biopsied.                           - Normal stomach.                           - Normal duodenal bulb and second portion of the                            duodenum. Recommendation:           - Patient has a contact number available for                            emergencies. The signs and symptoms of potential                            delayed complications were discussed with the                            patient. Return to normal activities tomorrow.                            Written discharge instructions were provided to the                            patient.                           - Clear liquid diet for 2 hours, then advance as                             tolerated to soft diet today.                           - Antireflux measures.                           - Resume prior diet tomorrow.                           - Continue present medications.                           - Await pathology results. Ladene Artist, MD 09/02/2018 10:41:50 AM This report has been signed electronically.

## 2018-09-02 NOTE — Patient Instructions (Signed)
YOU HAD AN ENDOSCOPIC PROCEDURE TODAY AT THE [facility]:   Refer to the procedure report that was given to you for any specific questions about what was found during the examination.  If the procedure report does not answer your questions, please call your gastroenterologist to clarify.  If you requested that your care partner not be given the details of your procedure findings, then the procedure report has been included in a sealed envelope for you to review at your convenience later.  YOU SHOULD EXPECT: Some feelings of bloating in the abdomen. Passage of more gas than usual.  Walking can help get rid of the air that was put into your GI tract during the procedure and reduce the bloating. If you had a lower endoscopy (such as a colonoscopy or flexible sigmoidoscopy) you may notice spotting of blood in your stool or on the toilet paper. If you underwent a bowel prep for your procedure, you may not have a normal bowel movement for a few days.  Please Note:  You might notice some irritation and congestion in your nose or some drainage.  This is from the oxygen used during your procedure.  There is no need for concern and it should clear up in a day or so.  SYMPTOMS TO REPORT IMMEDIATELY:   Following upper endoscopy (EGD)  Vomiting of blood or coffee ground material  New chest pain or pain under the shoulder blades  Painful or persistently difficult swallowing  New shortness of breath  Fever of 100F or higher  Black, tarry-looking stools  For urgent or emergent issues, a gastroenterologist can be reached at any hour by calling [phone].   DIET:  We do recommend a small meal at first, but then you may proceed to your regular diet.  Drink plenty of fluids but you should avoid alcoholic beverages for 24 hours.  ACTIVITY:  You should plan to take it easy for the rest of today and you should NOT DRIVE or use heavy machinery until tomorrow (because of the sedation medicines used  during the test).    FOLLOW UP: Our staff will call the number listed on your records the next business day following your procedure to check on you and address any questions or concerns that you may have regarding the information given to you following your procedure. If we do not reach you, we will leave a message.  However, if you are feeling well and you are not experiencing any problems, there is no need to return our call.  We will assume that you have returned to your regular daily activities without incident.  If any biopsies were taken you will be contacted by phone or by letter within the next 1-3 weeks.  Please call us at [phone] if you have not heard about the biopsies in 3 weeks.    SIGNATURES/CONFIDENTIALITY: You and/or your care partner have signed paperwork which will be entered into your electronic medical record.  These signatures attest to the fact that that the information above on your After Visit Summary has been reviewed and is understood.  Full responsibility of the confidentiality of this discharge information lies with you and/or your care-partner. 

## 2018-09-03 ENCOUNTER — Telehealth: Payer: Self-pay

## 2018-09-03 NOTE — Telephone Encounter (Signed)
NO ANSWER, MESSAGE LEFT FOR PATIENT. 

## 2018-09-16 ENCOUNTER — Encounter: Payer: Self-pay | Admitting: Gastroenterology

## 2019-06-18 IMAGING — RF DG ESOPHAGUS
7 series · 21 of 24 positions shown · non-contrast
Comparison: None.

CLINICAL DATA: Intermittent solid food obstruction. History of
esophageal stretching83 years ago..

EXAM:
ESOPHOGRAM / BARIUM SWALLOW / BARIUM TABLET STUDY
TECHNIQUE: Combined double contrast and single contrast examination performed
using effervescent crystals, thick barium liquid, and thin barium
liquid. The patient was observed with fluoroscopy swallowing a 13 mm
barium sulphate tablet.
FLUOROSCOPY TIME:  Fluoroscopy Time:  1.1 minute
Radiation Exposure Index (if provided by the fluoroscopic device):
This 5.7 mGy
Number of Acquired Spot Images: 0

[Series 1: cp_standard · 0.35mm/px · 3 of 32 frames shown (1 of 7)]
[frame 5/32]
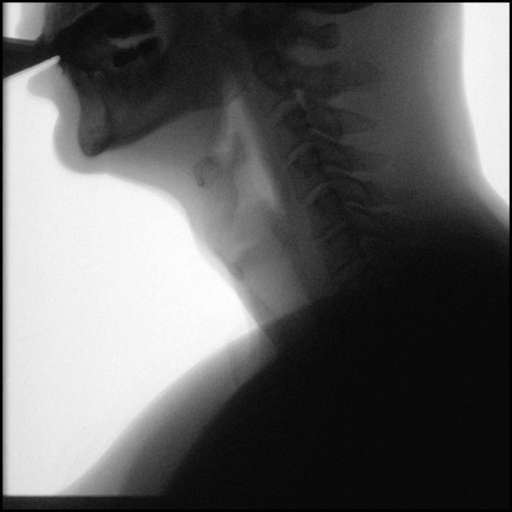
[frame 6/32]
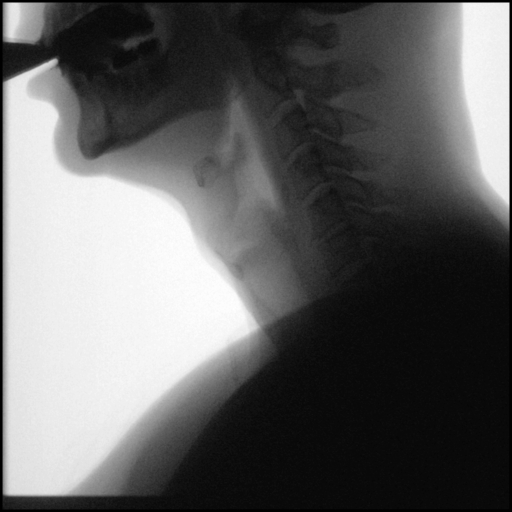
[frame 17/32]
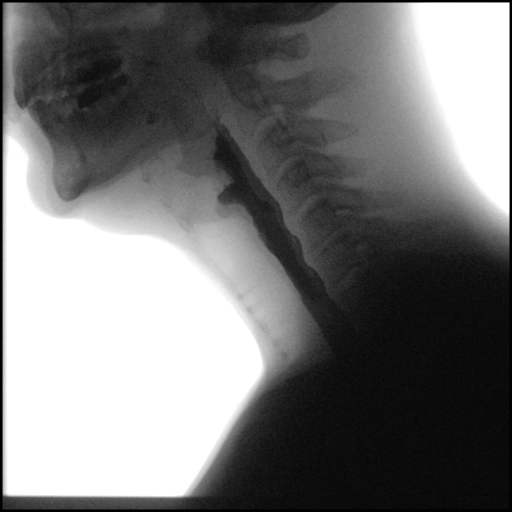

[Series 2: cp_standard · 0.52mm/px · 3 of 28 frames shown (2 of 7)]
[frame 5/28]
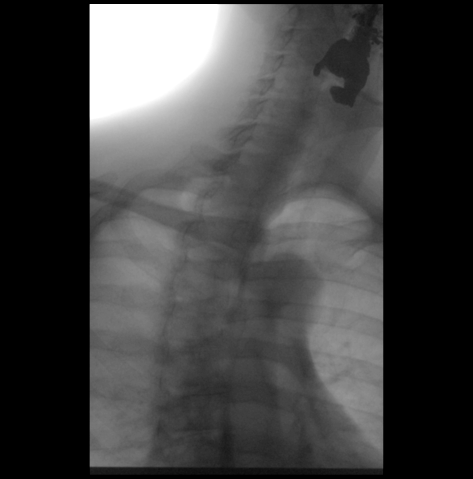
[frame 15/28]
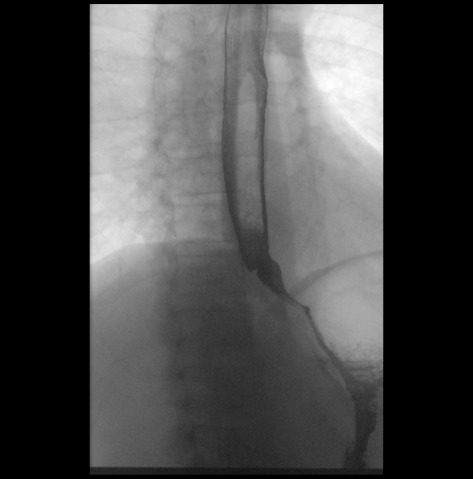
[frame 24/28]
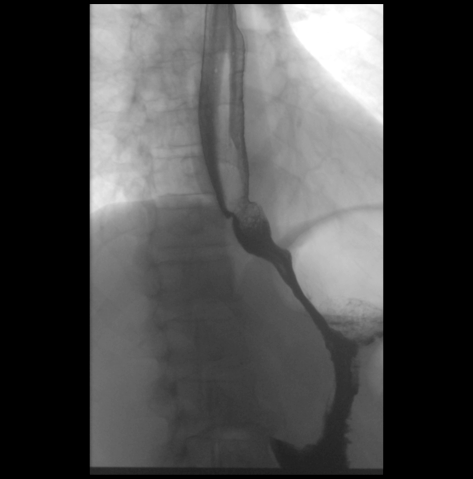

[Series 3: cp_standard · 0.35mm/px · 4 of 10 frames shown (3 of 7)]
[frame 1/10]
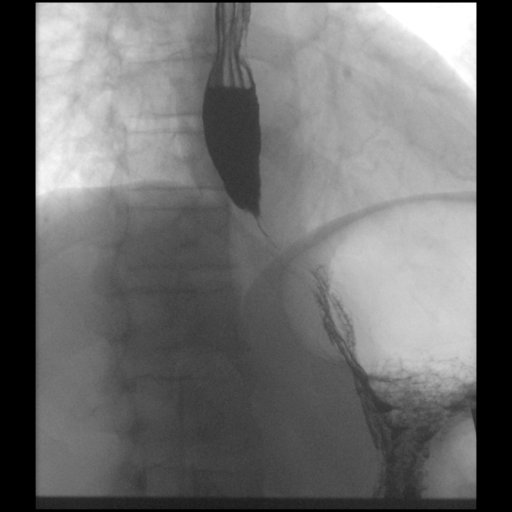
[frame 2/10]
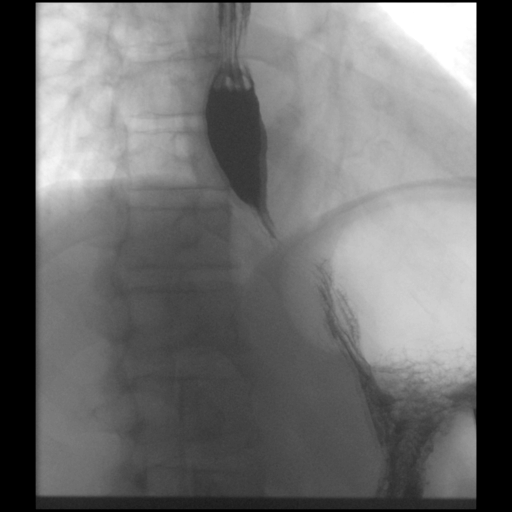
[frame 6/10]
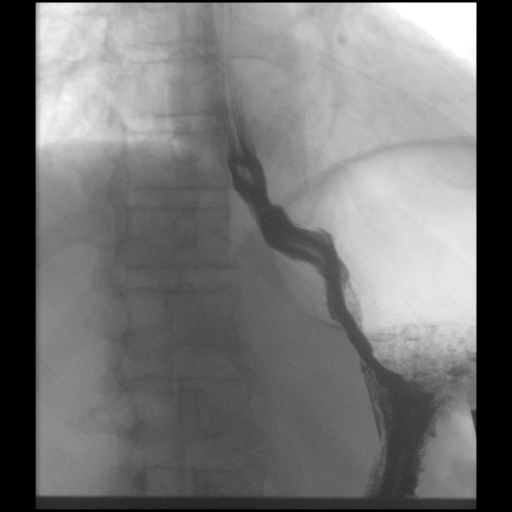
[frame 9/10]
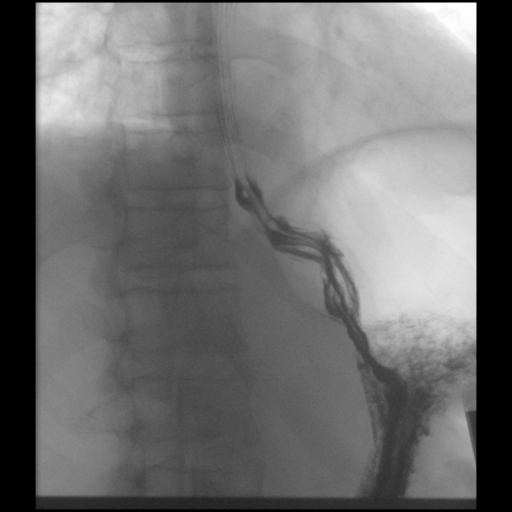

[Series 4: cp_standard · 0.59mm/px · 1 of 23 frames shown (4 of 7)]
[frame 20/23]
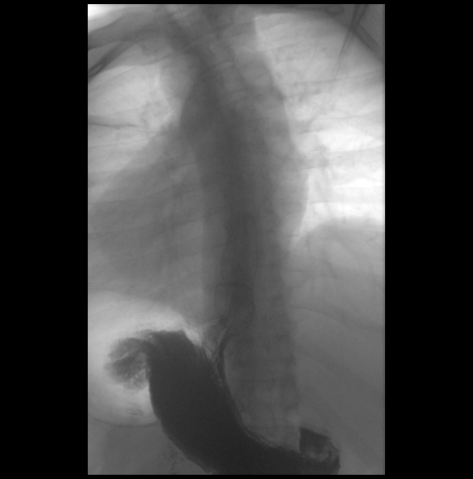

[Series 5: cp_standard · 0.59mm/px · 4 of 18 frames shown (5 of 7)]
[frame 3/18]
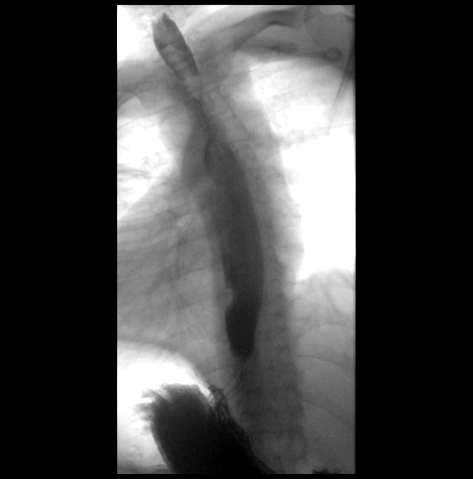
[frame 10/18]
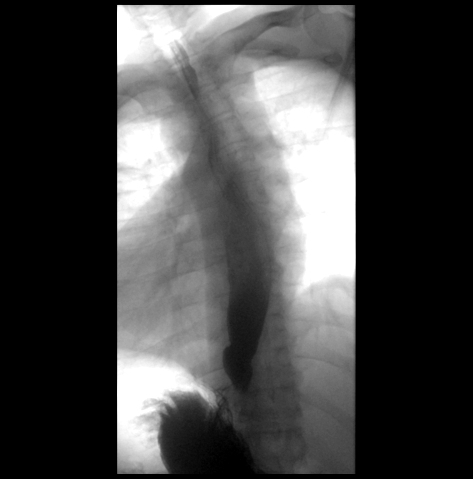
[frame 16/18]
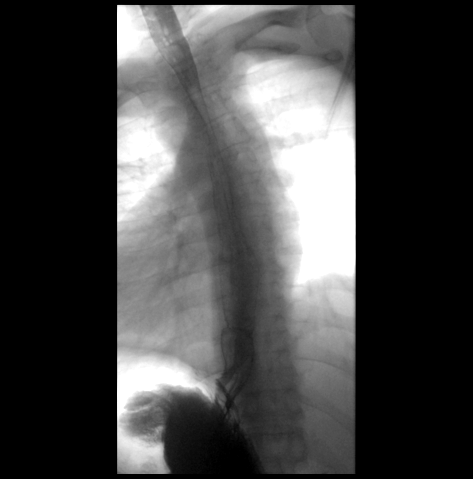
[frame 17/18]
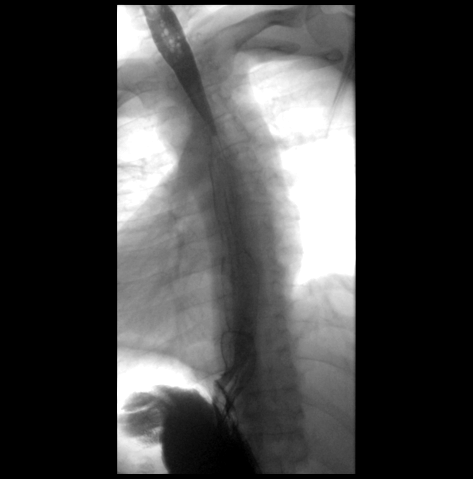

[Series 6: cp_standard · 0.39mm/px · 2 of 18 frames shown (6 of 7)]
[frame 3/18]
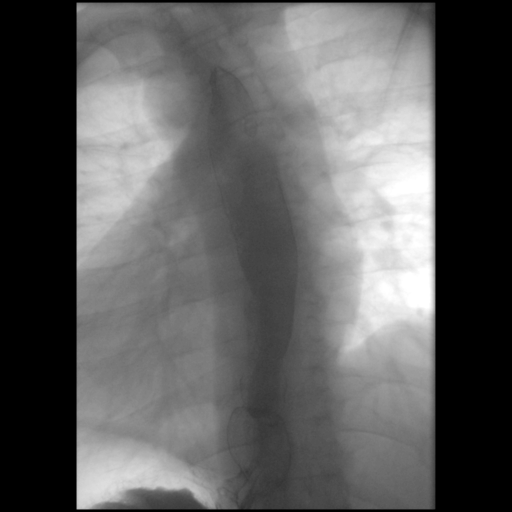
[frame 10/18]
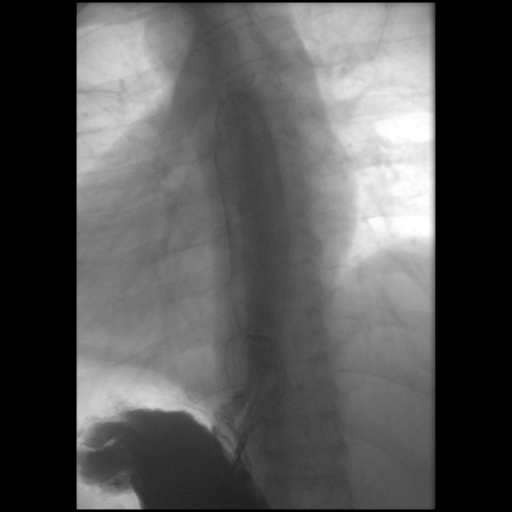

[Series 7: cp_standard · 0.52mm/px · 4 of 20 frames shown (7 of 7)]
[frame 1/20]
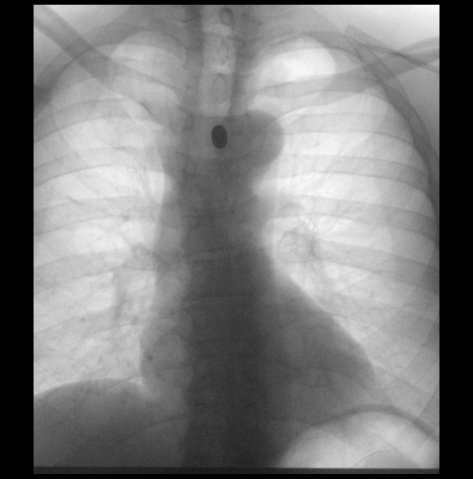
[frame 4/20]
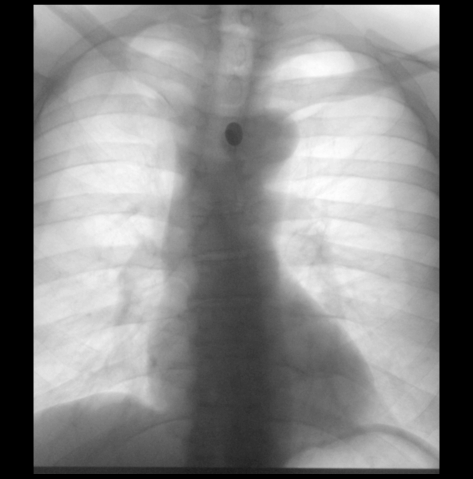
[frame 11/20]
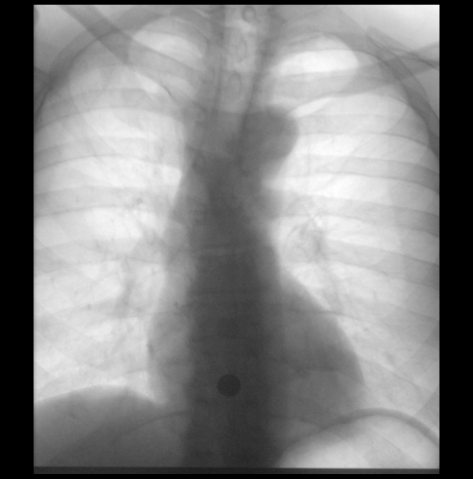
[frame 18/20]
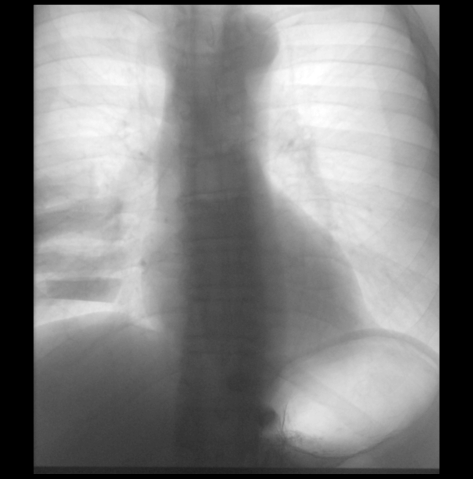

[21 of 24 positions shown; findings below may reference images not displayed]

FINDINGS: Lateral pharyngeal imaging is normal. There is good oral
coordination, no aspiration, no obstructive process.

Smooth mucosal contour. When upright there is a visible smooth ring
at the GE junction, but this was nonobstructive to a 13 mm barium
tablet and much less apparent on supine images. Esophageal motility
is normal. There is a minimal sliding hiatal hernia.
IMPRESSION: Mild and nonobstructive ring at the GE junction.

## 2019-07-31 NOTE — Progress Notes (Signed)
Subjective:    Patient ID: Jeffrey [name], male    DOB: 1960/10/21, 58 y.o.   MRN: [id number]  HPI The patient is here for an acute visit.   Intermittent low back pain:  It started 8 days ago.  He took aleve and it did not help.  He tried muscle relaxers and it dulled the pain a little.  Two nights ago the pain was severe.  It radiates down to the groin area.  He denies any pain going into the legs or back region.  He denies mid back pain.  He states he has had some lower back pain in the past related to something he did, but this feels different and has not gone away.  Certain movements and changes in position does make the pain worse.  He was concerned about the possibility of a kidney stone.  He denies any personal or family history of kidney stones.  He has not had any fevers or urinary symptoms.   Medications and allergies reviewed with patient and updated if appropriate.  Patient Active Problem List   Diagnosis Date Noted  . Dermatitis due to sun 02/07/2018  . Angular cheilosis 01/21/2018  . GASTROESOPHAGEAL REFLUX DISEASE 10/25/2007  . COLONIC POLYPS, ADENOMATOUS 08/20/2007  . ESOPHAGEAL STRICTURE 08/20/2007  . HIATAL HERNIA 08/20/2007    Current Outpatient Medications on File Prior to Visit  Medication Sig Dispense Refill  . clotrimazole-betamethasone (LOTRISONE) cream Apply 1 application topically 2 (two) times daily. 30 g 0  . diphenhydrAMINE (BENADRYL) 25 mg capsule Take 25 mg by mouth every 6 (six) hours as needed.    . Docosanol (ABREVA) 10 % CREA Apply topically. Abreva, apply as needed for fever blisters.    Marland Kitchen ibuprofen (ADVIL,MOTRIN) 200 MG tablet Take 200 mg by mouth every 6 (six) hours as needed.    Marland Kitchen omeprazole (PRILOSEC) 20 MG capsule Take 20mg  daily 90 capsule 3   No current facility-administered medications on file prior to visit.     Past Medical History:  Diagnosis Date  . Allergy   . Colonic polyp    adenomatous  . Esophageal stricture   . GERD  (gastroesophageal reflux disease)   . Hemorrhage, anal or rectal   . Hiatal hernia   . Reflux esophagitis     Past Surgical History:  Procedure Laterality Date  . COLONOSCOPY    . polyectomy  2008 & 2014  . POLYPECTOMY    . UPPER GASTROINTESTINAL ENDOSCOPY      Social History   Socioeconomic History  . Marital status: Married    Spouse name: Not on file  . Number of children: Not on file  . Years of education: Not on file  . Highest education level: Not on file  Occupational History  . Not on file  Social Needs  . Financial resource strain: Not on file  . Food insecurity    Worry: Not on file    Inability: Not on file  . Transportation needs    Medical: Not on file    Non-medical: Not on file  Tobacco Use  . Smoking status: Never Smoker  . Smokeless tobacco: Current User    Types: Snuff  Substance and Sexual Activity  . Alcohol use: Yes    Comment: occasionally  . Drug use: No  . Sexual activity: Not on file  Lifestyle  . Physical activity    Days per week: Not on file    Minutes per session: Not on file  .  Stress: Not on file  Relationships  . Social Herbalist on phone: Not on file    Gets together: Not on file    Attends religious service: Not on file    Active member of club or organization: Not on file    Attends meetings of clubs or organizations: Not on file    Relationship status: Not on file  Other Topics Concern  . Not on file  Social History Narrative   Umpires softball a lot      No regular exercise    Family History  Problem Relation Age of Onset  . Colon cancer Neg Hx   . Stomach cancer Neg Hx   . Esophageal cancer Neg Hx   . Rectal cancer Neg Hx   . Liver cancer Neg Hx   . Pancreatic cancer Neg Hx     Review of Systems  Constitutional: Negative for chills and fever.  Gastrointestinal: Negative for abdominal pain and nausea.  Genitourinary: Negative for difficulty urinating, dysuria and hematuria.  Musculoskeletal:  Positive for back pain (Right lower back - radiates into groin).  Neurological: Negative for light-headedness, numbness and headaches.       Objective:   Vitals:   08/01/19 0822  BP: 120/90  Pulse: 72  Temp: 98 F (36.7 C)  SpO2: 98%   BP Readings from Last 3 Encounters:  08/01/19 120/90  09/02/18 115/76  07/23/18 110/82   Wt Readings from Last 3 Encounters:  08/01/19 162 lb (73.5 kg)  09/02/18 164 lb (74.4 kg)  07/23/18 164 lb (74.4 kg)   Body mass index is 24.63 kg/m.   Physical Exam Constitutional:      General: He is not in acute distress.    Appearance: He is not ill-appearing.  Abdominal:     General: Abdomen is flat. There is no distension.     Palpations: Abdomen is soft.     Tenderness: There is no abdominal tenderness. There is no right CVA tenderness, left CVA tenderness, guarding or rebound.  Musculoskeletal:        General: Tenderness (Minimal tenderness near right SI region) present.     Right lower leg: No edema.     Left lower leg: No edema.  Skin:    General: Skin is warm and dry.  Neurological:     Mental Status: He is alert.            Assessment & Plan:    See Problem List for Assessment and Plan of chronic medical problems.    This visit occurred during the SARS-CoV-2 public health emergency.  Safety protocols were in place, including screening questions prior to the visit, additional usage of staff PPE, and extensive cleaning of exam room while observing appropriate contact time as indicated for disinfecting solutions.

## 2019-08-01 ENCOUNTER — Ambulatory Visit (INDEPENDENT_AMBULATORY_CARE_PROVIDER_SITE_OTHER): Payer: Managed Care, Other (non HMO) | Admitting: Internal Medicine

## 2019-08-01 ENCOUNTER — Other Ambulatory Visit: Payer: Self-pay

## 2019-08-01 ENCOUNTER — Encounter: Payer: Self-pay | Admitting: Internal Medicine

## 2019-08-01 DIAGNOSIS — M545 Low back pain, unspecified: Secondary | ICD-10-CM

## 2019-08-01 MED ORDER — PREDNISONE 20 MG PO TABS: 40.0000 mg | ORAL_TABLET | Freq: Every day | ORAL | 0 refills | Status: DC

## 2019-08-01 NOTE — Patient Instructions (Signed)
You may have pain in your right SI joint.   Continue to heat or ice the area.   Take the prednisone as prescribed.  If your pain has not improved by Monday then call and I will order a CT scan.

## 2019-08-01 NOTE — Assessment & Plan Note (Addendum)
Pain has been centered in the right lower back for 8 days Sounds more muscular skeletal in nature than a kidney stone.  Pain seems to be worse with certain changes in position and movement No urinary symptoms We will treat for musculoskeletal cause-possible SI joint inflammation with prednisone 40 mg daily x5 days.  Reviewed possible side effects. Can ice, heat. If his pain does not improve or worsens he will let me know may consider further evaluation for kidney stone with CT

## 2019-08-03 ENCOUNTER — Encounter: Payer: Self-pay | Admitting: Internal Medicine

## 2019-10-23 ENCOUNTER — Inpatient Hospital Stay (HOSPITAL_COMMUNITY)
Admission: EM | Admit: 2019-10-23 | Discharge: 2019-10-24 | DRG: 066 | Disposition: A | Discharge status: Home / Self Care | Payer: Managed Care, Other (non HMO) | Attending: Internal Medicine | Admitting: Internal Medicine

## 2019-10-23 ENCOUNTER — Emergency Department (HOSPITAL_COMMUNITY): Payer: Managed Care, Other (non HMO)

## 2019-10-23 ENCOUNTER — Encounter (HOSPITAL_COMMUNITY): Payer: Self-pay | Admitting: Internal Medicine

## 2019-10-23 ENCOUNTER — Other Ambulatory Visit: Payer: Self-pay

## 2019-10-23 DIAGNOSIS — Z8673 Personal history of transient ischemic attack (TIA), and cerebral infarction without residual deficits: Secondary | ICD-10-CM | POA: Insufficient documentation

## 2019-10-23 DIAGNOSIS — M545 Low back pain, unspecified: Secondary | ICD-10-CM | POA: Diagnosis present

## 2019-10-23 DIAGNOSIS — I639 Cerebral infarction, unspecified: Secondary | ICD-10-CM | POA: Diagnosis not present

## 2019-10-23 DIAGNOSIS — Z7952 Long term (current) use of systemic steroids: Secondary | ICD-10-CM

## 2019-10-23 DIAGNOSIS — F1410 Cocaine abuse, uncomplicated: Secondary | ICD-10-CM | POA: Diagnosis present

## 2019-10-23 DIAGNOSIS — R29702 NIHSS score 2: Secondary | ICD-10-CM | POA: Diagnosis present

## 2019-10-23 DIAGNOSIS — K219 Gastro-esophageal reflux disease without esophagitis: Secondary | ICD-10-CM | POA: Diagnosis present

## 2019-10-23 DIAGNOSIS — F17220 Nicotine dependence, chewing tobacco, uncomplicated: Secondary | ICD-10-CM | POA: Diagnosis present

## 2019-10-23 DIAGNOSIS — I739 Peripheral vascular disease, unspecified: Secondary | ICD-10-CM | POA: Diagnosis present

## 2019-10-23 DIAGNOSIS — R471 Dysarthria and anarthria: Secondary | ICD-10-CM | POA: Diagnosis present

## 2019-10-23 DIAGNOSIS — E785 Hyperlipidemia, unspecified: Secondary | ICD-10-CM | POA: Diagnosis present

## 2019-10-23 DIAGNOSIS — Z20822 Contact with and (suspected) exposure to covid-19: Secondary | ICD-10-CM | POA: Diagnosis present

## 2019-10-23 DIAGNOSIS — R29810 Facial weakness: Secondary | ICD-10-CM | POA: Diagnosis present

## 2019-10-23 DIAGNOSIS — Z79899 Other long term (current) drug therapy: Secondary | ICD-10-CM

## 2019-10-23 DIAGNOSIS — Z86010 Personal history of colonic polyps: Secondary | ICD-10-CM

## 2019-10-23 HISTORY — DX: Cerebral infarction, unspecified: I63.9

## 2019-10-23 LAB — COMPREHENSIVE METABOLIC PANEL
ALT: 17 U/L (ref 0–44)
AST: 20 U/L (ref 15–41)
Albumin: 3.9 g/dL (ref 3.5–5.0)
Alkaline Phosphatase: 70 U/L (ref 38–126)
Anion gap: 11 (ref 5–15)
BUN: 9 mg/dL (ref 6–20)
CO2: 23 mmol/L (ref 22–32)
Calcium: 9.3 mg/dL (ref 8.9–10.3)
Chloride: 102 mmol/L (ref 98–111)
Creatinine, Ser: 1.01 mg/dL (ref 0.61–1.24)
GFR calc Af Amer: >60 mL/min (ref >60)
GFR calc non Af Amer: >60 mL/min (ref >60)
Glucose, Bld: 114 mg/dL — ABNORMAL HIGH (ref 70–99)
Potassium: 4.1 mmol/L (ref 3.5–5.1)
Sodium: 136 mmol/L (ref 135–145)
Total Bilirubin: 0.6 mg/dL (ref 0.3–1.2)
Total Protein: 7.5 g/dL (ref 6.5–8.1)

## 2019-10-23 LAB — CBC
HCT: 45.7 % (ref 39.0–52.0)
HCT: 45.5 % (ref 39.0–52.0)
Hemoglobin: 16.3 g/dL (ref 13.0–17.0)
Hemoglobin: 16.2 g/dL (ref 13.0–17.0)
MCH: 32.1 pg (ref 26.0–34.0)
MCH: 31.7 pg (ref 26.0–34.0)
MCHC: 35.7 g/dL (ref 30.0–36.0)
MCHC: 35.6 g/dL (ref 30.0–36.0)
MCV: 90.1 fL (ref 80.0–100.0)
MCV: 89 fL (ref 80.0–100.0)
Platelets: 224 10*3/uL (ref 150–400)
Platelets: 224 10*3/uL (ref 150–400)
RBC: 5.07 MIL/uL (ref 4.22–5.81)
RBC: 5.11 MIL/uL (ref 4.22–5.81)
RDW: 12 % (ref 11.5–15.5)
RDW: 11.9 % (ref 11.5–15.5)
WBC: 9.2 10*3/uL (ref 4.0–10.5)
WBC: 7.4 10*3/uL (ref 4.0–10.5)
nRBC: 0 % (ref 0.0–0.2)
nRBC: 0 % (ref 0.0–0.2)

## 2019-10-23 LAB — RESPIRATORY PANEL BY RT PCR (FLU A&B, COVID)
Influenza A by PCR: NEGATIVE
Influenza B by PCR: NEGATIVE
SARS Coronavirus 2 by RT PCR: NEGATIVE

## 2019-10-23 LAB — I-STAT CHEM 8, ED
BUN: 10 mg/dL (ref 6–20)
Calcium, Ion: 1.13 mmol/L — ABNORMAL LOW (ref 1.15–1.40)
Chloride: 101 mmol/L (ref 98–111)
Creatinine, Ser: 1 mg/dL (ref 0.61–1.24)
Glucose, Bld: 104 mg/dL — ABNORMAL HIGH (ref 70–99)
HCT: 46 % (ref 39.0–52.0)
Hemoglobin: 15.6 g/dL (ref 13.0–17.0)
Potassium: 4.2 mmol/L (ref 3.5–5.1)
Sodium: 137 mmol/L (ref 135–145)
TCO2: 27 mmol/L (ref 22–32)

## 2019-10-23 LAB — DIFFERENTIAL
Abs Immature Granulocytes: 0.03 10*3/uL (ref 0.00–0.07)
Basophils Absolute: 0.1 10*3/uL (ref 0.0–0.1)
Basophils Relative: 1 %
Eosinophils Absolute: 0.2 10*3/uL (ref 0.0–0.5)
Eosinophils Relative: 2 %
Immature Granulocytes: 0 %
Lymphocytes Relative: 13 %
Lymphs Abs: 1.2 10*3/uL (ref 0.7–4.0)
Monocytes Absolute: 0.5 10*3/uL (ref 0.1–1.0)
Monocytes Relative: 5 %
Neutro Abs: 7.3 10*3/uL (ref 1.7–7.7)
Neutrophils Relative %: 79 %

## 2019-10-23 LAB — CBG MONITORING, ED: Glucose-Capillary: 101 mg/dL — ABNORMAL HIGH (ref 70–99)

## 2019-10-23 LAB — APTT: aPTT: 36 seconds (ref 24–36)

## 2019-10-23 LAB — PROTIME-INR
INR: 1 (ref 0.8–1.2)
Prothrombin Time: 12.8 seconds (ref 11.4–15.2)

## 2019-10-23 LAB — CREATININE, SERUM
Creatinine, Ser: 1.02 mg/dL (ref 0.61–1.24)
GFR calc Af Amer: >60 mL/min (ref >60)
GFR calc non Af Amer: >60 mL/min (ref >60)

## 2019-10-23 MED ORDER — IOHEXOL 350 MG/ML SOLN
75.0000 mL | Freq: Once | INTRAVENOUS | Status: AC | PRN
  Administered 2019-10-23: 75 mL via INTRAVENOUS

## 2019-10-23 MED ORDER — ONDANSETRON HCL 4 MG/2ML IJ SOLN
INTRAMUSCULAR | Status: AC
  Filled 2019-10-23: qty 2

## 2019-10-23 MED ORDER — ENOXAPARIN SODIUM 40 MG/0.4ML ~~LOC~~ SOLN
40.0000 mg | Freq: Every day | SUBCUTANEOUS | Status: DC
  Administered 2019-10-23: 40 mg via SUBCUTANEOUS
  Filled 2019-10-23: qty 0.4

## 2019-10-23 MED ORDER — CLOPIDOGREL BISULFATE 75 MG PO TABS
75.0000 mg | ORAL_TABLET | Freq: Every day | ORAL | Status: DC
  Administered 2019-10-24: 11:00:00 75 mg via ORAL
  Filled 2019-10-23: qty 1

## 2019-10-23 MED ORDER — ASPIRIN 300 MG RE SUPP: 300.0000 mg | Freq: Every day | RECTAL | Status: DC

## 2019-10-23 MED ORDER — SODIUM CHLORIDE 0.9 % IV SOLN: INTRAVENOUS | Status: DC

## 2019-10-23 MED ORDER — CLOPIDOGREL BISULFATE 300 MG PO TABS
300.0000 mg | ORAL_TABLET | Freq: Once | ORAL | Status: AC
  Administered 2019-10-23: 300 mg via ORAL
  Filled 2019-10-23: qty 1

## 2019-10-23 MED ORDER — ASPIRIN 325 MG PO TABS: 325.0000 mg | ORAL_TABLET | Freq: Every day | ORAL | Status: DC

## 2019-10-23 MED ORDER — ONDANSETRON HCL 4 MG/2ML IJ SOLN
4.0000 mg | Freq: Once | INTRAMUSCULAR | Status: AC
  Administered 2019-10-23: 19:00:00 4 mg via INTRAVENOUS

## 2019-10-23 MED ORDER — STROKE: EARLY STAGES OF RECOVERY BOOK
Freq: Once | Status: AC
  Filled 2019-10-23: qty 1

## 2019-10-23 MED ORDER — ACETAMINOPHEN 325 MG PO TABS: 650.0000 mg | ORAL_TABLET | ORAL | Status: DC | PRN

## 2019-10-23 MED ORDER — SODIUM CHLORIDE 0.9% FLUSH: 3.0000 mL | Freq: Once | INTRAVENOUS | Status: DC

## 2019-10-23 MED ORDER — ACETAMINOPHEN 650 MG RE SUPP: 650.0000 mg | RECTAL | Status: DC | PRN

## 2019-10-23 MED ORDER — ASPIRIN EC 81 MG PO TBEC
81.0000 mg | DELAYED_RELEASE_TABLET | Freq: Every day | ORAL | Status: DC
  Administered 2019-10-24: 81 mg via ORAL
  Filled 2019-10-23: qty 1

## 2019-10-23 MED ORDER — ACETAMINOPHEN 160 MG/5ML PO SOLN: 650.0000 mg | ORAL | Status: DC | PRN

## 2019-10-23 NOTE — ED Notes (Signed)
Patient crying googling his results. Pt reassured and will continue to monitor.

## 2019-10-23 NOTE — H&P (Signed)
History and Physical    [place] Hashimoto [id number] DOB: 1961/06/10 DOA: 10/23/2019  PCP: Binnie Rail, MD  Patient coming from: Home.  Chief Complaint: Difficulty speaking and left facial droop.  HPI: Jeffrey [name] is a 59 y.o. male with no significant past medical history started developing sudden onset of left facial droop and difficulty speaking.  Patient speech was slurred.  This happened around 3:30 PM today.  Patient was brought to the ER.  Has not had any weakness of the extremities or any difficulty swallowing or any visual symptoms.  ED Course: In the ER patient had a code stroke called and CT head followed by a CT angiogram of the head and neck were done.  Neurologist on-call was consulted and since patient's deficits were minimal patient was not felt to be a candidate for TPA and admitted for further management per stroke.  Patient passed swallow.  EKG shows normal sinus rhythm.  Covid test was negative.  Labs are largely unremarkable.  Review of Systems: As per HPI, rest all negative.   Past Medical History:  Diagnosis Date  . Allergy   . Colonic polyp    adenomatous  . Esophageal stricture   . GERD (gastroesophageal reflux disease)   . Hemorrhage, anal or rectal   . Hiatal hernia   . Reflux esophagitis     Past Surgical History:  Procedure Laterality Date  . COLONOSCOPY    . polyectomy  2008 & 2014  . POLYPECTOMY    . UPPER GASTROINTESTINAL ENDOSCOPY       reports that he has never smoked. His smokeless tobacco use includes snuff. He reports current alcohol use. He reports that he does not use drugs.  No Known Allergies  Family History  Problem Relation Age of Onset  . Colon cancer Neg Hx   . Stomach cancer Neg Hx   . Esophageal cancer Neg Hx   . Rectal cancer Neg Hx   . Liver cancer Neg Hx   . Pancreatic cancer Neg Hx     Prior to Admission medications   Medication Sig Start Date End Date Taking? Authorizing Provider  ibuprofen  (ADVIL,MOTRIN) 200 MG tablet Take 200-600 mg by mouth every 6 (six) hours as needed for headache or mild pain.    Yes [provider]  clotrimazole-betamethasone (LOTRISONE) cream Apply 1 application topically 2 (two) times daily. Patient not taking: Reported on 10/23/2019 01/21/18   Binnie Rail, MD  omeprazole (PRILOSEC) 20 MG capsule Take 20mg  daily Patient not taking: Reported on 10/23/2019 01/21/18   Binnie Rail, MD  predniSONE (DELTASONE) 20 MG tablet Take 2 tablets (40 mg total) by mouth daily with breakfast. Patient not taking: Reported on 10/23/2019 08/01/19   Binnie Rail, MD    Physical Exam: Constitutional: Moderately built and nourished. Vitals:   10/23/19 2045 10/23/19 2100 10/23/19 2135 10/23/19 2136  BP: (!) 149/100 (!) 141/103  128/83  Pulse: 63 73  (!) 54  Resp: 11 10  18   Temp:    97.9 F (36.6 C)  TempSrc:    Oral  SpO2: 99% 99%  100%  Weight:   71.6 kg   Height:   5\' 8"  (1.727 m)    Eyes: Anicteric no pallor. ENMT: No discharge from the ears eyes nose or mouth. Neck: No mass felt.  No neck rigidity. Respiratory: No rhonchi or crepitations. Cardiovascular: S1-S2 heard. Abdomen: Soft nontender bowel sound present. Musculoskeletal: No edema. Skin: No rash. Neurologic: Alert awake oriented to  time place and person.  Left facial droop.  Pupils equal and reacting to light.  Moves all extremities 5 x 5.  Tongue is midline. Psychiatric: Appears normal.   Labs on Admission: I have personally reviewed following labs and imaging studies  CBC: Recent Labs  Lab 10/23/19 1824 10/23/19 1834  WBC 9.2  --   NEUTROABS 7.3  --   HGB 16.3 15.6  HCT 45.7 46.0  MCV 90.1  --   PLT 224  --    Basic Metabolic Panel: Recent Labs  Lab 10/23/19 1824 10/23/19 1834  NA 136 137  K 4.1 4.2  CL 102 101  CO2 23  --   GLUCOSE 114* 104*  BUN 9 10  CREATININE 1.01 1.00  CALCIUM 9.3  --    GFR: Estimated Creatinine Clearance: 78.9 mL/min (by C-G formula based  on SCr of 1 mg/dL). Liver Function Tests: Recent Labs  Lab 10/23/19 1824  AST 20  ALT 17  ALKPHOS 70  BILITOT 0.6  PROT 7.5  ALBUMIN 3.9   No results for input(s): LIPASE, AMYLASE in the last 168 hours. No results for input(s): AMMONIA in the last 168 hours. Coagulation Profile: Recent Labs  Lab 10/23/19 1824  INR 1.0   Cardiac Enzymes: No results for input(s): CKTOTAL, CKMB, CKMBINDEX, TROPONINI in the last 168 hours. BNP (last 3 results) No results for input(s): PROBNP in the last 8760 hours. HbA1C: No results for input(s): HGBA1C in the last 72 hours. CBG: Recent Labs  Lab 10/23/19 1824  GLUCAP 101*   Lipid Profile: No results for input(s): CHOL, HDL, LDLCALC, TRIG, CHOLHDL, LDLDIRECT in the last 72 hours. Thyroid Function Tests: No results for input(s): TSH, T4TOTAL, FREET4, T3FREE, THYROIDAB in the last 72 hours. Anemia Panel: No results for input(s): VITAMINB12, FOLATE, FERRITIN, TIBC, IRON, RETICCTPCT in the last 72 hours. Urine analysis:    Component Value Date/Time   COLORURINE YELLOW 10/19/2012 1507   APPEARANCEUR CLEAR 10/19/2012 1507   LABSPEC 1.030 10/19/2012 1507   PHURINE 6.0 10/19/2012 1507   GLUCOSEU NEGATIVE 10/19/2012 1507   HGBUR NEGATIVE 10/19/2012 1507   BILIRUBINUR NEGATIVE 10/19/2012 1507   BILIRUBINUR Neg 10/02/2012 1200   KETONESUR NEGATIVE 10/19/2012 1507   PROTEINUR NEGATIVE 10/19/2012 1507   UROBILINOGEN 1.0 10/19/2012 1507   NITRITE NEGATIVE 10/19/2012 1507   LEUKOCYTESUR NEGATIVE 10/19/2012 1507   Sepsis Labs: @LABRCNTIP (procalcitonin:4,lacticidven:4) )No results found for this or any previous visit (from the past 240 hour(s)).   Radiological Exams on Admission: CT Code Stroke CTA Head W/WO contrast  Result Date: 10/23/2019 CLINICAL DATA:  Left-sided facial droop and slurred speech. EXAM: CT ANGIOGRAPHY HEAD AND NECK TECHNIQUE: Multidetector CT imaging of the head and neck was performed using the standard protocol during  bolus administration of intravenous contrast. Multiplanar CT image reconstructions and MIPs were obtained to evaluate the vascular anatomy. Carotid stenosis measurements (when applicable) are obtained utilizing NASCET criteria, using the distal internal carotid diameter as the denominator. CONTRAST:  13mL OMNIPAQUE IOHEXOL 350 MG/ML SOLN COMPARISON:  Head CT same day FINDINGS: CTA NECK FINDINGS Aortic arch: Aortic ectasia. Origins of the brachiocephalic vessels are not included. Right carotid system: Common carotid artery widely patent to the bifurcation. Carotid bifurcation is normal without soft or calcified plaque. Cervical ICA is normal. Left carotid system: Common carotid artery widely patent to the bifurcation. Minimal plaque at the carotid bifurcation but no stenosis or irregularity. Cervical ICA widely patent beyond that. Vertebral arteries: Both vertebral artery origins are widely patent.  Both vertebral arteries appear normal through the cervical region. Skeleton: Mild cervical spondylosis. Other neck: No mass or lymphadenopathy. Upper chest: Mild pleural and parenchymal scarring at the apices. Review of the MIP images confirms the above findings CTA HEAD FINDINGS Anterior circulation: Both internal carotid arteries are widely patent through the skull base and siphon regions. No siphon stenosis. Anterior and middle cerebral vessels are patent. No large or medium vessel occlusion. No proximal stenosis. No aneurysm or vascular malformation. Posterior circulation: Both vertebral arteries widely patent to the basilar. No basilar stenosis. Posterior circulation branch vessels are normal. Venous sinuses: Patent and normal. Anatomic variants: None significant. Review of the MIP images confirms the above findings IMPRESSION: Normal CT angiography of the head neck. These results were communicated to Dr. Leonel Ramsay at [place] 2/11/2021by text page via the [facility] messaging system. Electronically Signed   By: Nelson Chimes M.D.   On: 10/23/2019 18:55   CT Code Stroke CTA Neck W/WO contrast  Result Date: 10/23/2019 CLINICAL DATA:  Left-sided facial droop and slurred speech. EXAM: CT ANGIOGRAPHY HEAD AND NECK TECHNIQUE: Multidetector CT imaging of the head and neck was performed using the standard protocol during bolus administration of intravenous contrast. Multiplanar CT image reconstructions and MIPs were obtained to evaluate the vascular anatomy. Carotid stenosis measurements (when applicable) are obtained utilizing NASCET criteria, using the distal internal carotid diameter as the denominator. CONTRAST:  94mL OMNIPAQUE IOHEXOL 350 MG/ML SOLN COMPARISON:  Head CT same day FINDINGS: CTA NECK FINDINGS Aortic arch: Aortic ectasia. Origins of the brachiocephalic vessels are not included. Right carotid system: Common carotid artery widely patent to the bifurcation. Carotid bifurcation is normal without soft or calcified plaque. Cervical ICA is normal. Left carotid system: Common carotid artery widely patent to the bifurcation. Minimal plaque at the carotid bifurcation but no stenosis or irregularity. Cervical ICA widely patent beyond that. Vertebral arteries: Both vertebral artery origins are widely patent. Both vertebral arteries appear normal through the cervical region. Skeleton: Mild cervical spondylosis. Other neck: No mass or lymphadenopathy. Upper chest: Mild pleural and parenchymal scarring at the apices. Review of the MIP images confirms the above findings CTA HEAD FINDINGS Anterior circulation: Both internal carotid arteries are widely patent through the skull base and siphon regions. No siphon stenosis. Anterior and middle cerebral vessels are patent. No large or medium vessel occlusion. No proximal stenosis. No aneurysm or vascular malformation. Posterior circulation: Both vertebral arteries widely patent to the basilar. No basilar stenosis. Posterior circulation branch vessels are normal. Venous sinuses: Patent  and normal. Anatomic variants: None significant. Review of the MIP images confirms the above findings IMPRESSION: Normal CT angiography of the head neck. These results were communicated to Dr. Leonel Ramsay at [place] 2/11/2021by text page via the [facility] messaging system. Electronically Signed   By: Nelson Chimes M.D.   On: 10/23/2019 18:55   CT HEAD CODE STROKE WO CONTRAST  Result Date: 10/23/2019 CLINICAL DATA:  Code stroke. 59 year old male left side facial droop and slurred speech. EXAM: CT HEAD WITHOUT CONTRAST TECHNIQUE: Contiguous axial images were obtained from the base of the skull through the vertex without intravenous contrast. COMPARISON:  None. FINDINGS: Brain: No midline shift, mass effect, or evidence of intracranial mass lesion. No acute intracranial hemorrhage identified. No ventriculomegaly. Probable perivascular space inferior right basal ganglia on series 2, image 16. Gray-white matter differentiation is within normal limits throughout the brain. No cortically based acute infarct identified. Vascular: No suspicious intracranial vascular hyperdensity. Skull: No acute osseous  abnormality identified. Sinuses/Orbits: Opacified left frontal sinus, frontoethmoidal recess, and some of the left nasal cavity. Superimposed bilateral maxillary sinus mucoperiosteal thickening. Tympanic cavities and mastoids are clear. Other: Visualized orbits and scalp soft tissues are within normal limits. Left stylomastoid foramen appears within normal limits. ASPECTS [facility] Stroke Program Early CT Score) Total score (0-10 with 10 being normal): 10 IMPRESSION: 1. Negative non contrast CT appearance of the brain.  ASPECTS 10. 2. Bilateral sinus disease. 3. Results were communicated to at 6:41 pmon 2/11/2021by text page via the [facility] messaging system. Electronically Signed   By: Genevie Ann M.D.   On: 10/23/2019 18:41    EKG: Independently reviewed.  Normal sinus rhythm with IVCD.  Assessment/Plan Principal Problem:    Acute CVA (cerebrovascular accident) ([place])    1. Acute CVA -appreciate neurology consult.  Patient has been placed on Plavix 300 mg loading dose followed by Plavix 75 mg daily along with aspirin 81 mg daily for at least 3 weeks followed by single agent.  Check hemoglobin A1c lipid panel neurochecks 2D echo.  Speech therapy and physical therapy consult.   DVT prophylaxis: Lovenox. Code Status: Full code. Family Communication: Discussed with patient. Disposition Plan: Home. Consults called: Neurology. Admission status: Observation.   Rise Patience MD [facility] Pager [phone].  If 7PM-7AM, please contact night-coverage [email or web address] Password [facility]  10/23/2019, 10:26 PM

## 2019-10-23 NOTE — ED Triage Notes (Signed)
Pt presents w/slurred speech and Left side facial droop. LKW 1530

## 2019-10-23 NOTE — Consult Note (Signed)
Neurology Consultation Reason for Consult: Facial droop Referring Physician: Francia Greaves, P  CC: Facial droop  History is obtained from: Patient  HPI: Jeffrey [name] is a 59 y.o. male with no significant past medical history who presents with facial droop and dysarthria that started abruptly at 3:30 PM.  He states that it came on all of a sudden, worst at onset, nonprogressive.  Since that time he has had persistent dysarthria and facial droop and for that reason he sought care in the emergency department.  In the ER, it was recognized that he had focal deficits and was within the 4-1/2-hour window for IV TPA and therefore code stroke was activated.  I responded evaluated the patient, with an NIH of 2 for mild dysarthria and left lower facial weakness, though I do think that he is likely having a small ischemic stroke, his deficits are mild and nondisabling therefore I would not pursue IV TPA at the current time.   LKW: 3:30 PM tpa given?: no, mild symptoms    ROS: A 14 point ROS was performed and is negative except as noted in the HPI.    Past Medical History:  Diagnosis Date  . Allergy   . Colonic polyp    adenomatous  . Esophageal stricture   . GERD (gastroesophageal reflux disease)   . Hemorrhage, anal or rectal   . Hiatal hernia   . Reflux esophagitis      Family History  Problem Relation Age of Onset  . Colon cancer Neg Hx   . Stomach cancer Neg Hx   . Esophageal cancer Neg Hx   . Rectal cancer Neg Hx   . Liver cancer Neg Hx   . Pancreatic cancer Neg Hx      Social History:  reports that he has never smoked. His smokeless tobacco use includes snuff. He reports current alcohol use. He reports that he does not use drugs.   Exam: Current vital signs: BP (!) 144/101 (BP Location: Left Arm)   Pulse 81   Temp 98.3 F (36.8 C) (Oral)   Resp 16   SpO2 99%  Vital signs in last 24 hours: Temp:  [98.3 F (36.8 C)] 98.3 F (36.8 C) (02/11 1818) Pulse Rate:  [81] 81  (02/11 1818) Resp:  [16] 16 (02/11 1818) BP: (144)/(101) 144/101 (02/11 1818) SpO2:  [99 %] 99 % (02/11 1818)   Physical Exam  Constitutional: Appears well-developed and well-nourished.  Psych: Affect appropriate to situation Eyes: No scleral injection HENT: No OP obstrucion MSK: no joint deformities.  Cardiovascular: Normal rate and regular rhythm.  Respiratory: Effort normal, non-labored breathing GI: Soft.  No distension. There is no tenderness.  Skin: WDI  Neuro: Mental Status: Patient is awake, alert, oriented to person, place, month, year, and situation. Patient is able to give a clear and coherent history. No signs of aphasia or neglect Cranial Nerves: II: Visual Fields are full. Pupils are equal, round, and reactive to light.   III,IV, VI: EOMI without ptosis or diploplia.  V: Facial sensation is symmetric to temperature VII: Facial movement with mild left lower facial weakness.  VIII: hearing is intact to voice X: Uvula elevates symmetrically XI: Shoulder shrug is symmetric. XII: tongue is midline without atrophy or fasciculations.  Motor: Tone is normal. Bulk is normal. 5/5 strength was present in all four extremities. 1 Sensory: Sensation is symmetric to light touch and temperature in the arms and legs. Cerebellar: FNF and HKS are intact bilaterally    I have  reviewed labs in epic and the results pertinent to this consultation are: Chem-8-unremarkable, creatinine 1.0   I have reviewed the images obtained: CT head-negative acute  Impression: 59 year old male with acute onset dysarthria lower facial weakness most consistent with a small acute ischemic stroke.  Symptoms are too mild to merit the risk associated with IV TPA and therefore I would favor admitting for secondary risk factor modification.  Recommendations: - HgbA1c, fasting lipid panel - MRI of the brain without contrast - Frequent neuro checks - Echocardiogram - Prophylactic  therapy-Antiplatelet med: Aspirin - 81 mg daily, plavix 300mg  x1 followed by 75mg  daily x 3 weeks.  - Risk factor modification - Telemetry monitoring - PT consult, OT consult, Speech consult - Stroke team to follow    Roland Rack, MD [facility] [phone]  If 7pm- 7am, please page neurology on call as listed in [place].

## 2019-10-23 NOTE — ED Provider Notes (Signed)
[facility] EMERGENCY DEPARTMENT Provider Note   CSN: [id number] Arrival date & time: 10/23/19  1814     History Chief Complaint  Patient presents with  . Code Stroke    Jeffrey [name] is a 59 y.o. male.  59 year old male with prior medical history as detailed below presents for evaluation of suspected stroke.  Patient reports that around 330 this afternoon he developed slurred speech.  His family noted a slight left facial droop.  He arrives to the ED for evaluation.  He is otherwise without specific complaint.  Code stroke initiated in triage.  Patient has been seen by neuro.  On my evaluation the patient is without additional complaints outside of his slurred speech and left-sided facial droop.  The history is provided by the patient and medical records.  Cerebrovascular Accident This is a new problem. The current episode started 3 to 5 hours ago. The problem occurs constantly. The problem has not changed since onset.Pertinent negatives include no chest pain and no abdominal pain. Nothing aggravates the symptoms. Nothing relieves the symptoms.       Past Medical History:  Diagnosis Date  . Allergy   . Colonic polyp    adenomatous  . Esophageal stricture   . GERD (gastroesophageal reflux disease)   . Hemorrhage, anal or rectal   . Hiatal hernia   . Reflux esophagitis     Patient Active Problem List   Diagnosis Date Noted  . Lower back pain 08/01/2019  . Dermatitis due to sun 02/07/2018  . Angular cheilosis 01/21/2018  . GASTROESOPHAGEAL REFLUX DISEASE 10/25/2007  . COLONIC POLYPS, ADENOMATOUS 08/20/2007  . ESOPHAGEAL STRICTURE 08/20/2007  . HIATAL HERNIA 08/20/2007    Past Surgical History:  Procedure Laterality Date  . COLONOSCOPY    . polyectomy  2008 & 2014  . POLYPECTOMY    . UPPER GASTROINTESTINAL ENDOSCOPY         Family History  Problem Relation Age of Onset  . Colon cancer Neg Hx   . Stomach cancer Neg Hx   . Esophageal  cancer Neg Hx   . Rectal cancer Neg Hx   . Liver cancer Neg Hx   . Pancreatic cancer Neg Hx     Social History   Tobacco Use  . Smoking status: Never Smoker  . Smokeless tobacco: Current User    Types: Snuff  Substance Use Topics  . Alcohol use: Yes    Comment: occasionally  . Drug use: No    Home Medications Prior to Admission medications   Medication Sig Start Date End Date Taking? Authorizing Provider  clotrimazole-betamethasone (LOTRISONE) cream Apply 1 application topically 2 (two) times daily. 01/21/18   Binnie Rail, MD  diphenhydrAMINE (BENADRYL) 25 mg capsule Take 25 mg by mouth every 6 (six) hours as needed.    [provider]  Docosanol (ABREVA) 10 % CREA Apply topically. Abreva, apply as needed for fever blisters.    [provider]  ibuprofen (ADVIL,MOTRIN) 200 MG tablet Take 200 mg by mouth every 6 (six) hours as needed.    [provider]  omeprazole (PRILOSEC) 20 MG capsule Take 20mg  daily 01/21/18   Binnie Rail, MD  predniSONE (DELTASONE) 20 MG tablet Take 2 tablets (40 mg total) by mouth daily with breakfast. 08/01/19   Binnie Rail, MD    Allergies    Patient has no known allergies.  Review of Systems   Review of Systems  Cardiovascular: Negative for chest pain.  Gastrointestinal:  Negative for abdominal pain.  All other systems reviewed and are negative.   Physical Exam Updated Vital Signs BP (!) 144/106 (BP Location: Right Arm)   Pulse 81   Temp (!) 97.5 F (36.4 C) (Tympanic)   Resp 16   Ht 5\' 8"  (1.727 m)   Wt 74.5 kg   SpO2 98%   BMI 24.97 kg/m   Physical Exam Vitals and nursing note reviewed.  Constitutional:      General: He is not in acute distress.    Appearance: Normal appearance. He is well-developed.  HENT:     Head: Normocephalic and atraumatic.  Eyes:     Conjunctiva/sclera: Conjunctivae normal.     Pupils: Pupils are equal, round, and reactive to light.  Cardiovascular:     Rate and Rhythm:  Normal rate and regular rhythm.     Heart sounds: Normal heart sounds.  Pulmonary:     Effort: Pulmonary effort is normal. No respiratory distress.     Breath sounds: Normal breath sounds.  Abdominal:     General: There is no distension.     Palpations: Abdomen is soft.     Tenderness: There is no abdominal tenderness.  Musculoskeletal:        General: No deformity. Normal range of motion.     Cervical back: Normal range of motion and neck supple.  Skin:    General: Skin is warm and dry.  Neurological:     Mental Status: He is alert and oriented to person, place, and time. Mental status is at baseline.     Sensory: No sensory deficit.     Motor: No weakness.     Comments: Mild left facial droop  Mild slurred speech  Alert and oriented x3.  No other focal weakness.  NIH score of 2.     ED Results / Procedures / Treatments   Labs (all labs ordered are listed, but only abnormal results are displayed) Labs Reviewed  I-STAT CHEM 8, ED - Abnormal; Notable for the following components:      Result Value   Glucose, Bld 104 (*)    Calcium, Ion 1.13 (*)    All other components within normal limits  CBG MONITORING, ED - Abnormal; Notable for the following components:   Glucose-Capillary 101 (*)    All other components within normal limits  RESPIRATORY PANEL BY RT PCR (FLU A&B, COVID)  CBC  DIFFERENTIAL  PROTIME-INR  APTT  COMPREHENSIVE METABOLIC PANEL    EKG EKG Interpretation  Date/Time:  Thursday October 23 2019 18:55:59 EST Ventricular Rate:  71 PR Interval:    QRS Duration: 116 QT Interval:  425 QTC Calculation: 462 R Axis:   91 Text Interpretation: Sinus rhythm Nonspecific intraventricular conduction delay Confirmed by Dene Gentry [phone]) on 10/23/2019 7:00:25 PM   Radiology CT Code Stroke CTA Head W/WO contrast  Result Date: 10/23/2019 CLINICAL DATA:  Left-sided facial droop and slurred speech. EXAM: CT ANGIOGRAPHY HEAD AND NECK TECHNIQUE: Multidetector  CT imaging of the head and neck was performed using the standard protocol during bolus administration of intravenous contrast. Multiplanar CT image reconstructions and MIPs were obtained to evaluate the vascular anatomy. Carotid stenosis measurements (when applicable) are obtained utilizing NASCET criteria, using the distal internal carotid diameter as the denominator. CONTRAST:  94mL OMNIPAQUE IOHEXOL 350 MG/ML SOLN COMPARISON:  Head CT same day FINDINGS: CTA NECK FINDINGS Aortic arch: Aortic ectasia. Origins of the brachiocephalic vessels are not included. Right carotid system: Common carotid artery widely patent  to the bifurcation. Carotid bifurcation is normal without soft or calcified plaque. Cervical ICA is normal. Left carotid system: Common carotid artery widely patent to the bifurcation. Minimal plaque at the carotid bifurcation but no stenosis or irregularity. Cervical ICA widely patent beyond that. Vertebral arteries: Both vertebral artery origins are widely patent. Both vertebral arteries appear normal through the cervical region. Skeleton: Mild cervical spondylosis. Other neck: No mass or lymphadenopathy. Upper chest: Mild pleural and parenchymal scarring at the apices. Review of the MIP images confirms the above findings CTA HEAD FINDINGS Anterior circulation: Both internal carotid arteries are widely patent through the skull base and siphon regions. No siphon stenosis. Anterior and middle cerebral vessels are patent. No large or medium vessel occlusion. No proximal stenosis. No aneurysm or vascular malformation. Posterior circulation: Both vertebral arteries widely patent to the basilar. No basilar stenosis. Posterior circulation branch vessels are normal. Venous sinuses: Patent and normal. Anatomic variants: None significant. Review of the MIP images confirms the above findings IMPRESSION: Normal CT angiography of the head neck. These results were communicated to Dr. Leonel Ramsay at [facility]  2/11/2021by text page via the [facility] messaging system. Electronically Signed   By: Nelson Chimes M.D.   On: 10/23/2019 18:55   CT Code Stroke CTA Neck W/WO contrast  Result Date: 10/23/2019 CLINICAL DATA:  Left-sided facial droop and slurred speech. EXAM: CT ANGIOGRAPHY HEAD AND NECK TECHNIQUE: Multidetector CT imaging of the head and neck was performed using the standard protocol during bolus administration of intravenous contrast. Multiplanar CT image reconstructions and MIPs were obtained to evaluate the vascular anatomy. Carotid stenosis measurements (when applicable) are obtained utilizing NASCET criteria, using the distal internal carotid diameter as the denominator. CONTRAST:  38mL OMNIPAQUE IOHEXOL 350 MG/ML SOLN COMPARISON:  Head CT same day FINDINGS: CTA NECK FINDINGS Aortic arch: Aortic ectasia. Origins of the brachiocephalic vessels are not included. Right carotid system: Common carotid artery widely patent to the bifurcation. Carotid bifurcation is normal without soft or calcified plaque. Cervical ICA is normal. Left carotid system: Common carotid artery widely patent to the bifurcation. Minimal plaque at the carotid bifurcation but no stenosis or irregularity. Cervical ICA widely patent beyond that. Vertebral arteries: Both vertebral artery origins are widely patent. Both vertebral arteries appear normal through the cervical region. Skeleton: Mild cervical spondylosis. Other neck: No mass or lymphadenopathy. Upper chest: Mild pleural and parenchymal scarring at the apices. Review of the MIP images confirms the above findings CTA HEAD FINDINGS Anterior circulation: Both internal carotid arteries are widely patent through the skull base and siphon regions. No siphon stenosis. Anterior and middle cerebral vessels are patent. No large or medium vessel occlusion. No proximal stenosis. No aneurysm or vascular malformation. Posterior circulation: Both vertebral arteries widely patent to the basilar. No  basilar stenosis. Posterior circulation branch vessels are normal. Venous sinuses: Patent and normal. Anatomic variants: None significant. Review of the MIP images confirms the above findings IMPRESSION: Normal CT angiography of the head neck. These results were communicated to Dr. Leonel Ramsay at [place] 2/11/2021by text page via the [facility] messaging system. Electronically Signed   By: Nelson Chimes M.D.   On: 10/23/2019 18:55   CT HEAD CODE STROKE WO CONTRAST  Result Date: 10/23/2019 CLINICAL DATA:  Code stroke. 59 year old male left side facial droop and slurred speech. EXAM: CT HEAD WITHOUT CONTRAST TECHNIQUE: Contiguous axial images were obtained from the base of the skull through the vertex without intravenous contrast. COMPARISON:  None. FINDINGS: Brain: No midline shift,  mass effect, or evidence of intracranial mass lesion. No acute intracranial hemorrhage identified. No ventriculomegaly. Probable perivascular space inferior right basal ganglia on series 2, image 16. Gray-white matter differentiation is within normal limits throughout the brain. No cortically based acute infarct identified. Vascular: No suspicious intracranial vascular hyperdensity. Skull: No acute osseous abnormality identified. Sinuses/Orbits: Opacified left frontal sinus, frontoethmoidal recess, and some of the left nasal cavity. Superimposed bilateral maxillary sinus mucoperiosteal thickening. Tympanic cavities and mastoids are clear. Other: Visualized orbits and scalp soft tissues are within normal limits. Left stylomastoid foramen appears within normal limits. ASPECTS [facility] Stroke Program Early CT Score) Total score (0-10 with 10 being normal): 10 IMPRESSION: 1. Negative non contrast CT appearance of the brain.  ASPECTS 10. 2. Bilateral sinus disease. 3. Results were communicated to at 6:41 pmon 2/11/2021by text page via the [facility] messaging system. Electronically Signed   By: Genevie Ann M.D.   On: 10/23/2019 18:41     Procedures Procedures (including critical care time)  Medications Ordered in ED Medications  sodium chloride flush (NS) 0.9 % injection 3 mL (3 mLs Intravenous Not Given 10/23/19 1838)  ondansetron (ZOFRAN) 4 MG/2ML injection (has no administration in time range)  iohexol (OMNIPAQUE) 350 MG/ML injection 75 mL (75 mLs Intravenous Contrast Given 10/23/19 1842)  ondansetron (ZOFRAN) injection 4 mg (4 mg Intravenous Given 10/23/19 1852)    ED Course  I have reviewed the triage vital signs and the nursing notes.  Pertinent labs & imaging results that were available during my care of the patient were reviewed by me and considered in my medical decision making (see chart for details).    MDM Rules/Calculators/A&P                       MDM  Screen complete  Jeffrey [name] was evaluated in Emergency Department on 10/23/2019 for the symptoms described in the history of present illness. He was evaluated in the context of the global COVID-19 pandemic, which necessitated consideration that the patient might be at risk for infection with the SARS-CoV-2 virus that causes COVID-19. Institutional protocols and algorithms that pertain to the evaluation of patients at risk for COVID-19 are in a state of rapid change based on information released by regulatory bodies including the CDC and federal and state organizations. These policies and algorithms were followed during the patient's care in the ED.  Patient is presenting for evaluation of suspected stroke.  Patient's deficit is minimal with mild slurred speech and mild left facial droop.  He does not meet criteria for TPA or other acute intervention.  Patient has been seen by neuro.  Patient will require admission.  Medicine service aware of case and will evaluate for admission.     Final Clinical Impression(s) / ED Diagnoses Final diagnoses:  Cerebrovascular accident (CVA), unspecified mechanism ([place])    Rx / DC Orders ED Discharge  Orders    None       Valarie Merino, MD 10/23/19 1904

## 2019-10-23 NOTE — ED Notes (Signed)
Attempted to give report; nurse did not answer phone - will call back.

## 2019-10-23 NOTE — ED Notes (Signed)
Activated code stroke with doug from [facility]

## 2019-10-24 ENCOUNTER — Encounter (HOSPITAL_COMMUNITY): Payer: Self-pay | Admitting: *Deleted

## 2019-10-24 ENCOUNTER — Observation Stay (HOSPITAL_COMMUNITY): Payer: Managed Care, Other (non HMO)

## 2019-10-24 ENCOUNTER — Observation Stay (HOSPITAL_BASED_OUTPATIENT_CLINIC_OR_DEPARTMENT_OTHER): Payer: Managed Care, Other (non HMO)

## 2019-10-24 DIAGNOSIS — I639 Cerebral infarction, unspecified: Secondary | ICD-10-CM | POA: Diagnosis present

## 2019-10-24 DIAGNOSIS — I6389 Other cerebral infarction: Secondary | ICD-10-CM

## 2019-10-24 DIAGNOSIS — Z79899 Other long term (current) drug therapy: Secondary | ICD-10-CM | POA: Diagnosis not present

## 2019-10-24 DIAGNOSIS — Z86010 Personal history of colonic polyps: Secondary | ICD-10-CM | POA: Diagnosis not present

## 2019-10-24 DIAGNOSIS — F1410 Cocaine abuse, uncomplicated: Secondary | ICD-10-CM | POA: Diagnosis present

## 2019-10-24 DIAGNOSIS — Z7952 Long term (current) use of systemic steroids: Secondary | ICD-10-CM | POA: Diagnosis not present

## 2019-10-24 DIAGNOSIS — R29810 Facial weakness: Secondary | ICD-10-CM | POA: Diagnosis present

## 2019-10-24 DIAGNOSIS — I739 Peripheral vascular disease, unspecified: Secondary | ICD-10-CM | POA: Diagnosis present

## 2019-10-24 DIAGNOSIS — K219 Gastro-esophageal reflux disease without esophagitis: Secondary | ICD-10-CM | POA: Diagnosis present

## 2019-10-24 DIAGNOSIS — R29702 NIHSS score 2: Secondary | ICD-10-CM | POA: Diagnosis present

## 2019-10-24 DIAGNOSIS — M545 Low back pain: Secondary | ICD-10-CM | POA: Diagnosis present

## 2019-10-24 DIAGNOSIS — R471 Dysarthria and anarthria: Secondary | ICD-10-CM | POA: Diagnosis present

## 2019-10-24 DIAGNOSIS — I63311 Cerebral infarction due to thrombosis of right middle cerebral artery: Secondary | ICD-10-CM

## 2019-10-24 DIAGNOSIS — Z20822 Contact with and (suspected) exposure to covid-19: Secondary | ICD-10-CM | POA: Diagnosis present

## 2019-10-24 DIAGNOSIS — E785 Hyperlipidemia, unspecified: Secondary | ICD-10-CM | POA: Diagnosis present

## 2019-10-24 DIAGNOSIS — F17220 Nicotine dependence, chewing tobacco, uncomplicated: Secondary | ICD-10-CM | POA: Diagnosis present

## 2019-10-24 LAB — HEMOGLOBIN A1C
Hgb A1c MFr Bld: 5 % (ref 4.8–5.6)
Mean Plasma Glucose: 96.8 mg/dL

## 2019-10-24 LAB — RAPID URINE DRUG SCREEN, HOSP PERFORMED
Amphetamines: NOT DETECTED
Barbiturates: NOT DETECTED
Benzodiazepines: NOT DETECTED
Cocaine: POSITIVE — AB
Opiates: NOT DETECTED
Tetrahydrocannabinol: NOT DETECTED

## 2019-10-24 LAB — LIPID PANEL
Cholesterol: 166 mg/dL (ref 0–200)
HDL: 39 mg/dL — ABNORMAL LOW (ref >40)
LDL Cholesterol: 99 mg/dL (ref 0–99)
Total CHOL/HDL Ratio: 4.3 RATIO
Triglycerides: 141 mg/dL (ref <150)
VLDL: 28 mg/dL (ref 0–40)

## 2019-10-24 LAB — ECHOCARDIOGRAM COMPLETE
Height: 68 in
Weight: 2525.59 oz

## 2019-10-24 LAB — HIV ANTIBODY (ROUTINE TESTING W REFLEX): HIV Screen 4th Generation wRfx: NONREACTIVE

## 2019-10-24 MED ORDER — ASPIRIN 81 MG PO TBEC: 81.0000 mg | DELAYED_RELEASE_TABLET | Freq: Every day | ORAL | Status: AC

## 2019-10-24 MED ORDER — ATORVASTATIN CALCIUM 40 MG PO TABS
40.0000 mg | ORAL_TABLET | Freq: Every day | ORAL | Status: DC
  Administered 2019-10-24: 15:00:00 40 mg via ORAL
  Filled 2019-10-24: qty 1

## 2019-10-24 MED ORDER — CLOPIDOGREL BISULFATE 75 MG PO TABS: 75.0000 mg | ORAL_TABLET | Freq: Every day | ORAL | 0 refills | Status: AC

## 2019-10-24 MED ORDER — ATORVASTATIN CALCIUM 40 MG PO TABS: 40.0000 mg | ORAL_TABLET | Freq: Every day | ORAL | 1 refills | Status: DC

## 2019-10-24 NOTE — Evaluation (Addendum)
Occupational Therapy Evaluation Patient Details Name: Jeffrey [name] MRN: [id number] DOB: 1961-07-18 Today's Date: 10/24/2019    History of Present Illness Jeffrey [name] is a 59 y.o. male with no significant past medical history started developing sudden onset of left facial droop and difficulty speaking. MRI showing acute infartction in the right basal ganglia/posterior limb internal capsule.   Clinical Impression   PTA, pt was living with his wife and was independent and working from home. Pt presenting with expressive speech deficits and decreased coordination of LUE. Pt performing ADLs at Mod I-I level. Provided pt with handout and education on FM and bilateral coordination; pt verbalized understanding. Answered all questions and provided education in preparation for dc today. Recommend dc to home once medically stable per physician.     Follow Up Recommendations  No OT follow up;Supervision - Intermittent ; OP SLP.   Equipment Recommendations  None recommended by OT    Recommendations for Other Services       Precautions / Restrictions Precautions Precautions: None Restrictions Weight Bearing Restrictions: No      Mobility Bed Mobility Overal bed mobility: Independent                Transfers Overall transfer level: Independent Equipment used: None                  Balance Overall balance assessment: No apparent balance deficits (not formally assessed)                                         ADL either performed or assessed with clinical judgement   ADL Overall ADL's : Modified independent                                       General ADL Comments: Pt performing ADLs at Mod I level with increased time for LUE coorindation.      Vision Baseline Vision/History: Wears glasses Wears Glasses: Reading only Patient Visual Report: No change from baseline       Perception     Praxis      Pertinent Vitals/Pain  Pain Assessment: No/denies pain     Hand Dominance Right   Extremity/Trunk Assessment Upper Extremity Assessment Upper Extremity Assessment: LUE deficits/detail LUE Deficits / Details: Noting decreased coorindation compared to RUE. Decreased finger opposition and gross motor coorindation with finger-to-nose. WFL strength.  LUE Sensation: WNL LUE Coordination: decreased fine motor;decreased gross motor   Lower Extremity Assessment Lower Extremity Assessment: Defer to PT evaluation   Cervical / Trunk Assessment Cervical / Trunk Assessment: Normal   Communication Communication Communication: Expressive difficulties   Cognition Arousal/Alertness: Awake/alert Behavior During Therapy: WFL for tasks assessed/performed Overall Cognitive Status: Within Functional Limits for tasks assessed                                 General Comments: Slightly impulsive but feel this is baseline to personaility.    General Comments       Exercises Exercises: Other exercises Other Exercises Other Exercises: Providing pt with handout on FM exercises and ROM. Other Exercises: Educating pt on importance of neuroplasticity with use of LUE and bialteral coorindation; recommended typing activities and video games for FM and bilateral coorindation   Shoulder  Instructions      Home Living Family/patient expects to be discharged to:: Private residence Living Arrangements: Spouse/significant other Available Help at Discharge: Family Type of Home: House Home Access: Stairs to enter Technical brewer of Steps: 2   Home Layout: Two level Alternate Level Stairs-Number of Steps: (flight)                    Lives With: Spouse    Prior Functioning/Environment Level of Independence: Independent        Comments: Community ambulatory, 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        OT Problem List: Decreased range of motion;Decreased coordination;Impaired UE  functional use      OT Treatment/Interventions:      OT Goals(Current goals can be found in the care plan section) Acute Rehab OT Goals Patient Stated Goal: Resume work OT Goal Formulation: All assessment and education complete, DC therapy  OT Frequency:     Barriers to D/C:            Co-evaluation              AM-PAC OT "6 Clicks" Daily Activity     Outcome Measure Help from another person eating meals?: None Help from another person taking care of personal grooming?: None Help from another person toileting, which includes using toliet, bedpan, or urinal?: None Help from another person bathing (including washing, rinsing, drying)?: None Help from another person to put on and taking off regular upper body clothing?: None Help from another person to put on and taking off regular lower body clothing?: None 6 Click Score: 24   End of Session Nurse Communication: Mobility status  Activity Tolerance: Patient tolerated treatment well Patient left: in bed;with call bell/phone within reach;with nursing/sitter in room  OT Visit Diagnosis: Muscle weakness (generalized) (M62.81)                Time: [id number] OT Time Calculation (min): 22 min Charges:  OT General Charges $OT Visit: 1 Visit OT Evaluation $OT Eval Low Complexity: 1 Low  Jeffrey [name] MSOT, OTR/L Acute Rehab Pager: [phone] Office: [place] 10/24/2019, 3:26 PM

## 2019-10-24 NOTE — TOC Transition Note (Signed)
Transition of Care [facility]) - CM/SW Discharge Note   Patient Details  Name: Jeffrey [name] MRN: [id number] Date of Birth: Jun 01, 1961  [facility]) CM/SW Contact:  Pollie Friar, RN Phone Number: 10/24/2019, 2:37 PM   Clinical Narrative:    Pt discharging home with outpatient therapy arranged through [facility]. Orders in Epic and information on the AVS.  Pt has supervision at home and transportation to home.    Final next level of care: OP Rehab Barriers to Discharge: No Barriers Identified   Patient Goals and CMS Choice     Choice offered to / list presented to : Patient  Discharge Placement                       Discharge Plan and Services                                     Social Determinants of Health (SDOH) Interventions     Readmission Risk Interventions No flowsheet data found.

## 2019-10-24 NOTE — Progress Notes (Signed)
Pt discharge education and instructions completed with pt at bedside; pt voices understanding and denies any questions. Pt IV and telemetry removed; pt discharge home with spouse to transport him home. Pt to pick up electronically sent prescriptions from preferred pharmacy on file. Pt picked up by volunteers via wheelchair to transport off unit. Prior to leaving the unit; pt reported missing black columbia jacket he came in with from home to the ED. Pt family looked in room and couldn't find it. RN also looked and couldn't find it. Unit director Langley Gauss notified and she called the ED for follow. Francis Gaines [place] RN.

## 2019-10-24 NOTE — Progress Notes (Signed)
Patient admitted from home through [facility] ED ,c/o sudden slurred speech and left facial droop. Pt alert and oriented moves all extremities. Cardiac monitor in use,SR. Pt  NPO awaiting admission orders. Oriented to room and use of call light.  No acute distress. RN will continue to monitor. Pt..

## 2019-10-24 NOTE — Progress Notes (Signed)
  Echocardiogram 2D Echocardiogram has been performed.  Jennette Dubin 10/24/2019, 10:14 AM

## 2019-10-24 NOTE — Progress Notes (Signed)
STROKE TEAM PROGRESS NOTE   INTERVAL HISTORY Pt drowsy sleepy, lying in bed, after working with PT/OT.  He was able to walk with PT/OT and recommend outpatient PT/OT.  Patient still has significant slurred speech, left facial droop and left hand clumsiness.  He admitted that he used cocaine intermittently, last time used 1 week ago.  Vitals:   10/24/19 0601 10/24/19 0752 10/24/19 1000 10/24/19 1157  BP: (!) 129/100 133/74 (!) 128/97 (!) 129/91  Pulse: (!) 57 (!) 57 (!) 59 62  Resp: 16 16 16 16   Temp: (!) 97.3 F (36.3 C)  98.1 F (36.7 C) 97.8 F (36.6 C)  TempSrc: Oral  Oral Oral  SpO2: 98% 96% 100% 98%  Weight:      Height:        CBC:  Recent Labs  Lab 10/23/19 1824 10/23/19 1824 10/23/19 1834 10/23/19 2230  WBC 9.2  --   --  7.4  NEUTROABS 7.3  --   --   --   HGB 16.3   < > 15.6 16.2  HCT 45.7   < > 46.0 45.5  MCV 90.1  --   --  89.0  PLT 224  --   --  224   < > = values in this interval not displayed.    Basic Metabolic Panel:  Recent Labs  Lab 10/23/19 1824 10/23/19 1824 10/23/19 1834 10/23/19 2230  NA 136  --  137  --   K 4.1  --  4.2  --   CL 102  --  101  --   CO2 23  --   --   --   GLUCOSE 114*  --  104*  --   BUN 9  --  10  --   CREATININE 1.01   < > 1.00 1.02  CALCIUM 9.3  --   --   --    < > = values in this interval not displayed.   Lipid Panel:     Component Value Date/Time   CHOL 166 10/24/2019 0239   TRIG 141 10/24/2019 0239   HDL 39 (L) 10/24/2019 0239   CHOLHDL 4.3 10/24/2019 0239   VLDL 28 10/24/2019 0239   LDLCALC 99 10/24/2019 0239   HgbA1c:  Lab Results  Component Value Date   HGBA1C 5.0 10/24/2019   Urine Drug Screen: No results found for: LABOPIA, COCAINSCRNUR, LABBENZ, AMPHETMU, THCU, LABBARB  Alcohol Level No results found for: ETH  IMAGING past 48 hours CT Code Stroke CTA Head W/WO contrast  Result Date: 10/23/2019 CLINICAL DATA:  Left-sided facial droop and slurred speech. EXAM: CT ANGIOGRAPHY HEAD AND NECK  TECHNIQUE: Multidetector CT imaging of the head and neck was performed using the standard protocol during bolus administration of intravenous contrast. Multiplanar CT image reconstructions and MIPs were obtained to evaluate the vascular anatomy. Carotid stenosis measurements (when applicable) are obtained utilizing NASCET criteria, using the distal internal carotid diameter as the denominator. CONTRAST:  96mL OMNIPAQUE IOHEXOL 350 MG/ML SOLN COMPARISON:  Head CT same day FINDINGS: CTA NECK FINDINGS Aortic arch: Aortic ectasia. Origins of the brachiocephalic vessels are not included. Right carotid system: Common carotid artery widely patent to the bifurcation. Carotid bifurcation is normal without soft or calcified plaque. Cervical ICA is normal. Left carotid system: Common carotid artery widely patent to the bifurcation. Minimal plaque at the carotid bifurcation but no stenosis or irregularity. Cervical ICA widely patent beyond that. Vertebral arteries: Both vertebral artery origins are widely patent. Both vertebral arteries  appear normal through the cervical region. Skeleton: Mild cervical spondylosis. Other neck: No mass or lymphadenopathy. Upper chest: Mild pleural and parenchymal scarring at the apices. Review of the MIP images confirms the above findings CTA HEAD FINDINGS Anterior circulation: Both internal carotid arteries are widely patent through the skull base and siphon regions. No siphon stenosis. Anterior and middle cerebral vessels are patent. No large or medium vessel occlusion. No proximal stenosis. No aneurysm or vascular malformation. Posterior circulation: Both vertebral arteries widely patent to the basilar. No basilar stenosis. Posterior circulation branch vessels are normal. Venous sinuses: Patent and normal. Anatomic variants: None significant. Review of the MIP images confirms the above findings IMPRESSION: Normal CT angiography of the head neck. These results were communicated to Dr.  Leonel Ramsay at [place] 2/11/2021by text page via the [facility] messaging system. Electronically Signed   By: Nelson Chimes M.D.   On: 10/23/2019 18:55   CT Code Stroke CTA Neck W/WO contrast  Result Date: 10/23/2019 CLINICAL DATA:  Left-sided facial droop and slurred speech. EXAM: CT ANGIOGRAPHY HEAD AND NECK TECHNIQUE: Multidetector CT imaging of the head and neck was performed using the standard protocol during bolus administration of intravenous contrast. Multiplanar CT image reconstructions and MIPs were obtained to evaluate the vascular anatomy. Carotid stenosis measurements (when applicable) are obtained utilizing NASCET criteria, using the distal internal carotid diameter as the denominator. CONTRAST:  60mL OMNIPAQUE IOHEXOL 350 MG/ML SOLN COMPARISON:  Head CT same day FINDINGS: CTA NECK FINDINGS Aortic arch: Aortic ectasia. Origins of the brachiocephalic vessels are not included. Right carotid system: Common carotid artery widely patent to the bifurcation. Carotid bifurcation is normal without soft or calcified plaque. Cervical ICA is normal. Left carotid system: Common carotid artery widely patent to the bifurcation. Minimal plaque at the carotid bifurcation but no stenosis or irregularity. Cervical ICA widely patent beyond that. Vertebral arteries: Both vertebral artery origins are widely patent. Both vertebral arteries appear normal through the cervical region. Skeleton: Mild cervical spondylosis. Other neck: No mass or lymphadenopathy. Upper chest: Mild pleural and parenchymal scarring at the apices. Review of the MIP images confirms the above findings CTA HEAD FINDINGS Anterior circulation: Both internal carotid arteries are widely patent through the skull base and siphon regions. No siphon stenosis. Anterior and middle cerebral vessels are patent. No large or medium vessel occlusion. No proximal stenosis. No aneurysm or vascular malformation. Posterior circulation: Both vertebral arteries widely  patent to the basilar. No basilar stenosis. Posterior circulation branch vessels are normal. Venous sinuses: Patent and normal. Anatomic variants: None significant. Review of the MIP images confirms the above findings IMPRESSION: Normal CT angiography of the head neck. These results were communicated to Dr. Leonel Ramsay at [place] 2/11/2021by text page via the [facility] messaging system. Electronically Signed   By: Nelson Chimes M.D.   On: 10/23/2019 18:55   MR BRAIN WO CONTRAST  Result Date: 10/24/2019 CLINICAL DATA:  Slurred speech. Question stroke. Left-sided facial droop. EXAM: MRI HEAD WITHOUT CONTRAST TECHNIQUE: Multiplanar, multiecho pulse sequences of the brain and surrounding structures were obtained without intravenous contrast. COMPARISON:  CT studies 10/23/2019 FINDINGS: Brain: Diffusion imaging shows an acute infarction in the right basal ganglia/posterior limb internal capsule. This measures maximally 13 mm in size. No other acute infarction. Elsewhere, the brainstem and cerebellum are normal. Minimal small-vessel changes seen elsewhere within the cerebral hemispheres. No large vessel territory infarction. No mass lesion, hemorrhage, hydrocephalus or extra-axial collection. Vascular: Major vessels at the base of the brain show flow.  Skull and upper cervical spine: Negative Sinuses/Orbits: Mucosal inflammatory changes of the paranasal sinuses. Chronically small maxillary sinuses. Orbits negative. Other: None IMPRESSION: Acute infarction in the right basal ganglia/posterior limb internal capsule. Maximal dimension 13 mm. No hemorrhage. Electronically Signed   By: Nelson Chimes M.D.   On: 10/24/2019 01:42   ECHOCARDIOGRAM COMPLETE  Result Date: 10/24/2019    ECHOCARDIOGRAM REPORT   Patient Name:   Jeffrey [name] Date of Exam: 10/24/2019 Medical Rec #:  [id number]        Height:       68.0 in Accession #:    [id number]       Weight:       157.8 lb Date of Birth:  [birth year]        BSA:          1.85 m  Patient Age:    82 years         BP:           133/74 mmHg Patient Gender: M                HR:           57 bpm. Exam Location:  Inpatient Procedure: 2D Echo Indications:    Stroke I163.9  History:        Patient has no prior history of Echocardiogram examinations.  Sonographer:    Mikki Santee RDCS (AE) Referring Phys: [place]  1. No obvious source of embolus Bubble study not performed.  2. Left ventricular ejection fraction, by estimation, is 60 to 65%. The left ventricle has normal function. The left ventricle has no regional wall motion abnormalities. Left ventricular diastolic parameters were normal.  3. Right ventricular systolic function is normal. The right ventricular size is normal.  4. Redundant subvalvular chordae tendina. The mitral valve is normal in structure and function. No evidence of mitral valve regurgitation. No evidence of mitral stenosis.  5. The aortic valve is normal in structure and function. Aortic valve regurgitation is not visualized. No aortic stenosis is present. FINDINGS  Left Ventricle: Left ventricular ejection fraction, by estimation, is 60 to 65%. The left ventricle has normal function. The left ventricle has no regional wall motion abnormalities. The left ventricular internal cavity size was normal in size. There is  no left ventricular hypertrophy. Left ventricular diastolic parameters were normal. Right Ventricle: The right ventricular size is normal. No increase in right ventricular wall thickness. Right ventricular systolic function is normal. Left Atrium: Left atrial size was normal in size. Right Atrium: Right atrial size was normal in size. Pericardium: There is no evidence of pericardial effusion. Mitral Valve: Redundant subvalvular chordae tendina. The mitral valve is normal in structure and function. Normal mobility of the mitral valve leaflets. No evidence of mitral valve regurgitation. No evidence of mitral valve stenosis. Tricuspid  Valve: The tricuspid valve is normal in structure. Tricuspid valve regurgitation is trivial. No evidence of tricuspid stenosis. Aortic Valve: The aortic valve is normal in structure and function. Aortic valve regurgitation is not visualized. No aortic stenosis is present. Pulmonic Valve: The pulmonic valve was normal in structure. Pulmonic valve regurgitation is trivial. No evidence of pulmonic stenosis. Aorta: The aortic root is normal in size and structure. IAS/Shunts: No atrial level shunt detected by color flow Doppler. Additional Comments: No obvious source of embolus Bubble study not performed.  LEFT VENTRICLE PLAX 2D LVIDd:         4.63 cm  Diastology LVIDs:  2.90 cm  LV e' lateral:   12.00 cm/s LV PW:         1.00 cm  LV E/e' lateral: 4.5 LV IVS:        0.94 cm  LV e' medial:    11.10 cm/s LVOT diam:     2.10 cm  LV E/e' medial:  4.8 LV SV:         84.51 ml LV SV Index:   35.82 LVOT Area:     3.46 cm  RIGHT VENTRICLE RV S prime:     15.70 cm/s TAPSE (M-mode): 1.9 cm LEFT ATRIUM           Index       RIGHT ATRIUM           Index LA diam:      3.30 cm 1.79 cm/m  RA Area:     14.70 cm LA Vol (A4C): 30.6 ml 16.56 ml/m RA Volume:   37.70 ml  20.40 ml/m  AORTIC VALVE LVOT Vmax:   112.00 cm/s LVOT Vmean:  75.400 cm/s LVOT VTI:    0.244 m  AORTA Ao Root diam: 3.00 cm MITRAL VALVE MV Area (PHT): 1.72 cm    SHUNTS MV Decel Time: 440 msec    Systemic VTI:  0.24 m MV E velocity: 53.60 cm/s  Systemic Diam: 2.10 cm MV A velocity: 45.90 cm/s MV E/A ratio:  1.17 Jenkins Rouge MD Electronically signed by Jenkins Rouge MD Signature Date/Time: 10/24/2019/10:45:34 AM    Final    CT HEAD CODE STROKE WO CONTRAST  Result Date: 10/23/2019 CLINICAL DATA:  Code stroke. 59 year old male left side facial droop and slurred speech. EXAM: CT HEAD WITHOUT CONTRAST TECHNIQUE: Contiguous axial images were obtained from the base of the skull through the vertex without intravenous contrast. COMPARISON:  None. FINDINGS: Brain: No  midline shift, mass effect, or evidence of intracranial mass lesion. No acute intracranial hemorrhage identified. No ventriculomegaly. Probable perivascular space inferior right basal ganglia on series 2, image 16. Gray-white matter differentiation is within normal limits throughout the brain. No cortically based acute infarct identified. Vascular: No suspicious intracranial vascular hyperdensity. Skull: No acute osseous abnormality identified. Sinuses/Orbits: Opacified left frontal sinus, frontoethmoidal recess, and some of the left nasal cavity. Superimposed bilateral maxillary sinus mucoperiosteal thickening. Tympanic cavities and mastoids are clear. Other: Visualized orbits and scalp soft tissues are within normal limits. Left stylomastoid foramen appears within normal limits. ASPECTS [facility] Stroke Program Early CT Score) Total score (0-10 with 10 being normal): 10 IMPRESSION: 1. Negative non contrast CT appearance of the brain.  ASPECTS 10. 2. Bilateral sinus disease. 3. Results were communicated to at 6:41 [place] 2/11/2021by text page via the [facility] messaging system. Electronically Signed   By: Genevie Ann M.D.   On: 10/23/2019 18:41    PHYSICAL EXAM  Temp:  [97.2 F (36.2 C)-98.4 F (36.9 C)] 97.8 F (36.6 C) (02/12 1157) Pulse Rate:  [54-83] 62 (02/12 1157) Resp:  [10-18] 16 (02/12 1157) BP: (119-159)/(70-127) 129/91 (02/12 1157) SpO2:  [94 %-100 %] 98 % (02/12 1157) Weight:  [71.6 kg-74.5 kg] 71.6 kg (02/11 2135)  General - Well nourished, well developed, drowsy and sleepy but able to arouse and cooperating with exam.  Ophthalmologic - fundi not visualized due to noncooperation.  Cardiovascular - Regular rhythm and rate.  Mental Status -  Drowsy and sleepy but easily arousable, orientation to time, place, and person were intact. Language including expression, naming, repetition, comprehension was assessed and found  intact.  Mild dysarthria Fund of Knowledge was assessed and was  intact.  Cranial Nerves II - XII - II - Visual field intact OU. III, IV, VI - Extraocular movements intact. V - Facial sensation intact bilaterally. VII - Left facial droop. VIII - Hearing & vestibular intact bilaterally. X - Palate elevates symmetrically, mild dysarthria. XI - Chin turning & shoulder shrug intact bilaterally. XII - Tongue protrusion intact.  Motor Strength - The patient's strength was normal in all extremities except mild pronator drift and left hand dexterity difficulty.  Bulk was normal and fasciculations were absent.   Motor Tone - Muscle tone was assessed at the neck and appendages and was normal.  Reflexes - The patient's reflexes were symmetrical in all extremities and he had no pathological reflexes.  Sensory - Light touch, temperature/pinprick were assessed and were symmetrical.    Coordination - The patient had normal movements in the right hand with no ataxia or dysmetria, slight left hand finger-to-nose dysmetria but proportional to the weakness.  Tremor was absent.  Gait and Station - deferred.   ASSESSMENT/PLAN Jeffrey [name] is a 59 y.o. male with no significant past medical history presenting with L facial droop and dysarthria.   Stroke:   R BG/PLIC small infarct secondary to small vessel disease source  Code Stroke CT head No acute abnormality. sinus dz. ASPECTS 10.     CTA head & neck normal  MRI  R basal ganglia / PLIC infarct   2D Echo EF 60-65%. No source of embolus   LDL 99  HgbA1c 5.0  Lovenox 40 mg sq daily for VTE prophylaxis  No antithrombotic prior to admission, now on aspirin 81 mg daily and clopidogrel 75 mg daily following load. Continue DAPT x 3 weeks then ASPIRIN alone    Therapy recommendations:  OP SLP, no PT  Disposition:  Return home  Follow-up Stroke Clinic at [facility] in 4 weeks. Office will call with appointment date and time. Order placed.  Cocaine abuse  Patient admitted  intermittent cocaine use  Last use was 1 week ago  UDS positive for cocaine  Cocaine cessation education provided  Patient is waiting to create  Hyperlipidemia  Home meds:  No statin  Now on lipitor 40   LDL 99, goal < 70  Continue statin at discharge  Other Stroke Risk Factors  Smokeless tobacco user (snuff)  ETOH use,  advised to drink no more than 2 drink(s) a day  Hospital day # 0  Neurology will sign off. Please call with questions. Pt will follow up with stroke clinic NP at [facility] in about 4 weeks. Thanks for the consult.  Rosalin Hawking, MD PhD Stroke Neurology 10/24/2019 5:51 PM  To contact Stroke Continuity provider, please refer to [email or web address] After hours, contact General Neurology

## 2019-10-24 NOTE — Progress Notes (Signed)
Return to unit from MRI

## 2019-10-24 NOTE — Discharge Summary (Signed)
Physician Discharge Summary  Jeffrey [name] [id number] DOB: December 28, 1960 DOA: 10/23/2019  PCP: Binnie Rail, MD  Admit date: 10/23/2019 Discharge date: 10/24/2019  Admitted From: home Discharge disposition: home   Recommendations for Outpatient Follow-Up:   1. Follow-up with neurology as scheduled 2. Outpatient speech therapy   Discharge Diagnosis:   Principal Problem:   CVA (cerebral vascular accident) ([place]) Active Problems:   GASTROESOPHAGEAL REFLUX DISEASE   Lower back pain    Discharge Condition: Improved.  Diet recommendation: Low sodium, Wound care: None.  Code status: Full.   History of Present Illness:   Jeffrey [name] is a 59 y.o. male with no significant past medical history started developed sudden onset of left facial droop and difficulty speaking on October 23, 2019.  Patient speech was slurred.  This happened around 3:30 PM.  Patient was brought to the ER.  Had not had any weakness of the extremities or any difficulty swallowing or any visual symptoms.  Work-up included an MRI that revealed acute infarction in the right basal ganglia/posterior limb internal capsule.  He was admitted for complete stroke work-up and evaluation by neurology  Hospital Course by Problem:   #1.  Acute CVA.  No history of same.  MRI as noted above.  CT angiography of the head and neck normal.  Echocardiogram with an EF of 60 to 65% with normal left ventricular function, lipid panel with HDL 39 otherwise levels within the limits of normal.  Hemoglobin A1c within the limits of normal.  EKG with normal sinus rhythm.  Urine drug screen pending at time of discharge.  Evaluated by physical therapy occupational therapy and speech therapy.  Speech therapy recommends outpatient speech therapy otherwise no other outpatient therapies needed.  Evaluated by neurology who recommend aspirin and Plavix for 3 weeks then aspirin alone.  #2.  GERD.  Stable at baseline.  #3.  Low back  pain.  Stable at baseline.    Medical Consultants:   [facility]   Discharge Exam:   Vitals:   10/24/19 1000 10/24/19 1157  BP: (!) 128/97 (!) 129/91  Pulse: (!) 59 62  Resp: 16 16  Temp: 98.1 F (36.7 C) 97.8 F (36.6 C)  SpO2: 100% 98%   Vitals:   10/24/19 0601 10/24/19 0752 10/24/19 1000 10/24/19 1157  BP: (!) 129/100 133/74 (!) 128/97 (!) 129/91  Pulse: (!) 57 (!) 57 (!) 59 62  Resp: 16 16 16 16   Temp: (!) 97.3 F (36.3 C)  98.1 F (36.7 C) 97.8 F (36.6 C)  TempSrc: Oral  Oral Oral  SpO2: 98% 96% 100% 98%  Weight:      Height:        General exam: Appears calm and comfortable.  Respiratory system: Clear to auscultation. Respiratory effort normal. Cardiovascular system: S1 & S2 heard, RRR. No JVD,  rubs, gallops or clicks. No murmurs. Gastrointestinal system: Abdomen is nondistended, soft and nontender. No organomegaly or masses felt. Normal bowel sounds heard. Central nervous system: Alert and oriented. No focal neurological deficits. Extremities: No clubbing,  or cyanosis. No edema. Skin: No rashes, lesions or ulcers. Psychiatry: Judgement and insight appear normal. Mood & affect appropriate.  Neuro alert and oriented x3 speech relating in room with a steady gait.  Bilateral grip 5 out of 5 tongue midline speech is quite slurred.  No difficulty chewing or swallowing slight facial droop   The results of significant diagnostics from this hospitalization (including imaging, microbiology, ancillary and laboratory) are  listed below for reference.     Procedures and Diagnostic Studies:   CT Code Stroke CTA Head W/WO contrast  Result Date: 10/23/2019 CLINICAL DATA:  Left-sided facial droop and slurred speech. EXAM: CT ANGIOGRAPHY HEAD AND NECK TECHNIQUE: Multidetector CT imaging of the head and neck was performed using the standard protocol during bolus administration of intravenous contrast. Multiplanar CT image reconstructions and MIPs were obtained to  evaluate the vascular anatomy. Carotid stenosis measurements (when applicable) are obtained utilizing NASCET criteria, using the distal internal carotid diameter as the denominator. CONTRAST:  21mL OMNIPAQUE IOHEXOL 350 MG/ML SOLN COMPARISON:  Head CT same day FINDINGS: CTA NECK FINDINGS Aortic arch: Aortic ectasia. Origins of the brachiocephalic vessels are not included. Right carotid system: Common carotid artery widely patent to the bifurcation. Carotid bifurcation is normal without soft or calcified plaque. Cervical ICA is normal. Left carotid system: Common carotid artery widely patent to the bifurcation. Minimal plaque at the carotid bifurcation but no stenosis or irregularity. Cervical ICA widely patent beyond that. Vertebral arteries: Both vertebral artery origins are widely patent. Both vertebral arteries appear normal through the cervical region. Skeleton: Mild cervical spondylosis. Other neck: No mass or lymphadenopathy. Upper chest: Mild pleural and parenchymal scarring at the apices. Review of the MIP images confirms the above findings CTA HEAD FINDINGS Anterior circulation: Both internal carotid arteries are widely patent through the skull base and siphon regions. No siphon stenosis. Anterior and middle cerebral vessels are patent. No large or medium vessel occlusion. No proximal stenosis. No aneurysm or vascular malformation. Posterior circulation: Both vertebral arteries widely patent to the basilar. No basilar stenosis. Posterior circulation branch vessels are normal. Venous sinuses: Patent and normal. Anatomic variants: None significant. Review of the MIP images confirms the above findings IMPRESSION: Normal CT angiography of the head neck. These results were communicated to Dr. Leonel Ramsay at [place] 2/11/2021by text page via the [facility] messaging system. Electronically Signed   By: Nelson Chimes M.D.   On: 10/23/2019 18:55   CT Code Stroke CTA Neck W/WO contrast  Result Date:  10/23/2019 CLINICAL DATA:  Left-sided facial droop and slurred speech. EXAM: CT ANGIOGRAPHY HEAD AND NECK TECHNIQUE: Multidetector CT imaging of the head and neck was performed using the standard protocol during bolus administration of intravenous contrast. Multiplanar CT image reconstructions and MIPs were obtained to evaluate the vascular anatomy. Carotid stenosis measurements (when applicable) are obtained utilizing NASCET criteria, using the distal internal carotid diameter as the denominator. CONTRAST:  84mL OMNIPAQUE IOHEXOL 350 MG/ML SOLN COMPARISON:  Head CT same day FINDINGS: CTA NECK FINDINGS Aortic arch: Aortic ectasia. Origins of the brachiocephalic vessels are not included. Right carotid system: Common carotid artery widely patent to the bifurcation. Carotid bifurcation is normal without soft or calcified plaque. Cervical ICA is normal. Left carotid system: Common carotid artery widely patent to the bifurcation. Minimal plaque at the carotid bifurcation but no stenosis or irregularity. Cervical ICA widely patent beyond that. Vertebral arteries: Both vertebral artery origins are widely patent. Both vertebral arteries appear normal through the cervical region. Skeleton: Mild cervical spondylosis. Other neck: No mass or lymphadenopathy. Upper chest: Mild pleural and parenchymal scarring at the apices. Review of the MIP images confirms the above findings CTA HEAD FINDINGS Anterior circulation: Both internal carotid arteries are widely patent through the skull base and siphon regions. No siphon stenosis. Anterior and middle cerebral vessels are patent. No large or medium vessel occlusion. No proximal stenosis. No aneurysm or vascular malformation. Posterior circulation: Both  vertebral arteries widely patent to the basilar. No basilar stenosis. Posterior circulation branch vessels are normal. Venous sinuses: Patent and normal. Anatomic variants: None significant. Review of the MIP images confirms the above  findings IMPRESSION: Normal CT angiography of the head neck. These results were communicated to Dr. Leonel Ramsay at [place] 2/11/2021by text page via the [facility] messaging system. Electronically Signed   By: Nelson Chimes M.D.   On: 10/23/2019 18:55   MR BRAIN WO CONTRAST  Result Date: 10/24/2019 CLINICAL DATA:  Slurred speech. Question stroke. Left-sided facial droop. EXAM: MRI HEAD WITHOUT CONTRAST TECHNIQUE: Multiplanar, multiecho pulse sequences of the brain and surrounding structures were obtained without intravenous contrast. COMPARISON:  CT studies 10/23/2019 FINDINGS: Brain: Diffusion imaging shows an acute infarction in the right basal ganglia/posterior limb internal capsule. This measures maximally 13 mm in size. No other acute infarction. Elsewhere, the brainstem and cerebellum are normal. Minimal small-vessel changes seen elsewhere within the cerebral hemispheres. No large vessel territory infarction. No mass lesion, hemorrhage, hydrocephalus or extra-axial collection. Vascular: Major vessels at the base of the brain show flow. Skull and upper cervical spine: Negative Sinuses/Orbits: Mucosal inflammatory changes of the paranasal sinuses. Chronically small maxillary sinuses. Orbits negative. Other: None IMPRESSION: Acute infarction in the right basal ganglia/posterior limb internal capsule. Maximal dimension 13 mm. No hemorrhage. Electronically Signed   By: Nelson Chimes M.D.   On: 10/24/2019 01:42   ECHOCARDIOGRAM COMPLETE  Result Date: 10/24/2019    ECHOCARDIOGRAM REPORT   Patient Name:   Jeffrey [name] Date of Exam: 10/24/2019 Medical Rec #:  [id number]        Height:       68.0 in Accession #:    [id number]       Weight:       157.8 lb Date of Birth:  February 09, 1961        BSA:          1.85 m Patient Age:    59 years         BP:           133/74 mmHg Patient Gender: M                HR:           57 bpm. Exam Location:  Inpatient Procedure: 2D Echo Indications:    Stroke I163.9  History:         Patient has no prior history of Echocardiogram examinations.  Sonographer:    Mikki Santee RDCS (AE) Referring Phys: [place]  1. No obvious source of embolus Bubble study not performed.  2. Left ventricular ejection fraction, by estimation, is 60 to 65%. The left ventricle has normal function. The left ventricle has no regional wall motion abnormalities. Left ventricular diastolic parameters were normal.  3. Right ventricular systolic function is normal. The right ventricular size is normal.  4. Redundant subvalvular chordae tendina. The mitral valve is normal in structure and function. No evidence of mitral valve regurgitation. No evidence of mitral stenosis.  5. The aortic valve is normal in structure and function. Aortic valve regurgitation is not visualized. No aortic stenosis is present. FINDINGS  Left Ventricle: Left ventricular ejection fraction, by estimation, is 60 to 65%. The left ventricle has normal function. The left ventricle has no regional wall motion abnormalities. The left ventricular internal cavity size was normal in size. There is  no left ventricular hypertrophy. Left ventricular diastolic parameters were normal. Right Ventricle: The right  ventricular size is normal. No increase in right ventricular wall thickness. Right ventricular systolic function is normal. Left Atrium: Left atrial size was normal in size. Right Atrium: Right atrial size was normal in size. Pericardium: There is no evidence of pericardial effusion. Mitral Valve: Redundant subvalvular chordae tendina. The mitral valve is normal in structure and function. Normal mobility of the mitral valve leaflets. No evidence of mitral valve regurgitation. No evidence of mitral valve stenosis. Tricuspid Valve: The tricuspid valve is normal in structure. Tricuspid valve regurgitation is trivial. No evidence of tricuspid stenosis. Aortic Valve: The aortic valve is normal in structure and function. Aortic  valve regurgitation is not visualized. No aortic stenosis is present. Pulmonic Valve: The pulmonic valve was normal in structure. Pulmonic valve regurgitation is trivial. No evidence of pulmonic stenosis. Aorta: The aortic root is normal in size and structure. IAS/Shunts: No atrial level shunt detected by color flow Doppler. Additional Comments: No obvious source of embolus Bubble study not performed.  LEFT VENTRICLE PLAX 2D LVIDd:         4.63 cm  Diastology LVIDs:         2.90 cm  LV e' lateral:   12.00 cm/s LV PW:         1.00 cm  LV E/e' lateral: 4.5 LV IVS:        0.94 cm  LV e' medial:    11.10 cm/s LVOT diam:     2.10 cm  LV E/e' medial:  4.8 LV SV:         84.51 ml LV SV Index:   35.82 LVOT Area:     3.46 cm  RIGHT VENTRICLE RV S prime:     15.70 cm/s TAPSE (M-mode): 1.9 cm LEFT ATRIUM           Index       RIGHT ATRIUM           Index LA diam:      3.30 cm 1.79 cm/m  RA Area:     14.70 cm LA Vol (A4C): 30.6 ml 16.56 ml/m RA Volume:   37.70 ml  20.40 ml/m  AORTIC VALVE LVOT Vmax:   112.00 cm/s LVOT Vmean:  75.400 cm/s LVOT VTI:    0.244 m  AORTA Ao Root diam: 3.00 cm MITRAL VALVE MV Area (PHT): 1.72 cm    SHUNTS MV Decel Time: 440 msec    Systemic VTI:  0.24 m MV E velocity: 53.60 cm/s  Systemic Diam: 2.10 cm MV A velocity: 45.90 cm/s MV E/A ratio:  1.17 Jenkins Rouge MD Electronically signed by Jenkins Rouge MD Signature Date/Time: 10/24/2019/10:45:34 AM    Final    CT HEAD CODE STROKE WO CONTRAST  Result Date: 10/23/2019 CLINICAL DATA:  Code stroke. 59 year old male left side facial droop and slurred speech. EXAM: CT HEAD WITHOUT CONTRAST TECHNIQUE: Contiguous axial images were obtained from the base of the skull through the vertex without intravenous contrast. COMPARISON:  None. FINDINGS: Brain: No midline shift, mass effect, or evidence of intracranial mass lesion. No acute intracranial hemorrhage identified. No ventriculomegaly. Probable perivascular space inferior right basal ganglia on series  2, image 16. Gray-white matter differentiation is within normal limits throughout the brain. No cortically based acute infarct identified. Vascular: No suspicious intracranial vascular hyperdensity. Skull: No acute osseous abnormality identified. Sinuses/Orbits: Opacified left frontal sinus, frontoethmoidal recess, and some of the left nasal cavity. Superimposed bilateral maxillary sinus mucoperiosteal thickening. Tympanic cavities and mastoids are clear. Other: Visualized orbits and  scalp soft tissues are within normal limits. Left stylomastoid foramen appears within normal limits. ASPECTS [facility] Stroke Program Early CT Score) Total score (0-10 with 10 being normal): 10 IMPRESSION: 1. Negative non contrast CT appearance of the brain.  ASPECTS 10. 2. Bilateral sinus disease. 3. Results were communicated to at 6:41 pmon 2/11/2021by text page via the [facility] messaging system. Electronically Signed   By: Genevie Ann M.D.   On: 10/23/2019 18:41     Labs:   Basic Metabolic Panel: Recent Labs  Lab 10/23/19 1824 10/23/19 1834 10/23/19 2230  NA 136 137  --   K 4.1 4.2  --   CL 102 101  --   CO2 23  --   --   GLUCOSE 114* 104*  --   BUN 9 10  --   CREATININE 1.01 1.00 1.02  CALCIUM 9.3  --   --    GFR Estimated Creatinine Clearance: 77.3 mL/min (by C-G formula based on SCr of 1.02 mg/dL). Liver Function Tests: Recent Labs  Lab 10/23/19 1824  AST 20  ALT 17  ALKPHOS 70  BILITOT 0.6  PROT 7.5  ALBUMIN 3.9   No results for input(s): LIPASE, AMYLASE in the last 168 hours. No results for input(s): AMMONIA in the last 168 hours. Coagulation profile Recent Labs  Lab 10/23/19 1824  INR 1.0    CBC: Recent Labs  Lab 10/23/19 1824 10/23/19 1834 10/23/19 2230  WBC 9.2  --  7.4  NEUTROABS 7.3  --   --   HGB 16.3 15.6 16.2  HCT 45.7 46.0 45.5  MCV 90.1  --  89.0  PLT 224  --  224   Cardiac Enzymes: No results for input(s): CKTOTAL, CKMB, CKMBINDEX, TROPONINI in the last 168  hours. BNP: Invalid input(s): POCBNP CBG: Recent Labs  Lab 10/23/19 1824  GLUCAP 101*   D-Dimer No results for input(s): DDIMER in the last 72 hours. Hgb A1c Recent Labs    10/24/19 0239  HGBA1C 5.0   Lipid Profile Recent Labs    10/24/19 0239  CHOL 166  HDL 39*  LDLCALC 99  TRIG 141  CHOLHDL 4.3   Thyroid function studies No results for input(s): TSH, T4TOTAL, T3FREE, THYROIDAB in the last 72 hours.  Invalid input(s): FREET3 Anemia work up No results for input(s): VITAMINB12, FOLATE, FERRITIN, TIBC, IRON, RETICCTPCT in the last 72 hours. Microbiology Recent Results (from the past 240 hour(s))  Respiratory Panel by RT PCR (Flu A&B, Covid) - Nasopharyngeal Swab     Status: None   Collection Time: 10/23/19  9:08 PM   Specimen: Nasopharyngeal Swab  Result Value Ref Range Status   SARS Coronavirus 2 by RT PCR NEGATIVE NEGATIVE Final    Comment: (NOTE) SARS-CoV-2 target nucleic acids are NOT DETECTED. The SARS-CoV-2 RNA is generally detectable in upper respiratoy specimens during the acute phase of infection. The lowest concentration of SARS-CoV-2 viral copies this assay can detect is 131 copies/mL. A negative result does not preclude SARS-Cov-2 infection and should not be used as the sole basis for treatment or other patient management decisions. A negative result may occur with  improper specimen collection/handling, submission of specimen other than nasopharyngeal swab, presence of viral mutation(s) within the areas targeted by this assay, and inadequate number of viral copies (<131 copies/mL). A negative result must be combined with clinical observations, patient history, and epidemiological information. The expected result is Negative. Fact Sheet for Patients:  PinkCheek.be Fact Sheet for Healthcare Providers:  GravelBags.it This  test is not yet ap proved or cleared by the [place] and  has  been authorized for detection and/or diagnosis of SARS-CoV-2 by FDA under an Emergency Use Authorization (EUA). This EUA will remain  in effect (meaning this test can be used) for the duration of the COVID-19 declaration under Section 564(b)(1) of the Act, 21 U.S.C. section 360bbb-3(b)(1), unless the authorization is terminated or revoked sooner.    Influenza A by PCR NEGATIVE NEGATIVE Final   Influenza B by PCR NEGATIVE NEGATIVE Final    Comment: (NOTE) The Xpert Xpress SARS-CoV-2/FLU/RSV assay is intended as an aid in  the diagnosis of influenza from Nasopharyngeal swab specimens and  should not be used as a sole basis for treatment. Nasal washings and  aspirates are unacceptable for Xpert Xpress SARS-CoV-2/FLU/RSV  testing. Fact Sheet for Patients: PinkCheek.be Fact Sheet for Healthcare Providers: GravelBags.it This test is not yet approved or cleared by the [place] FDA and  has been authorized for detection and/or diagnosis of SARS-CoV-2 by  FDA under an Emergency Use Authorization (EUA). This EUA will remain  in effect (meaning this test can be used) for the duration of the  Covid-19 declaration under Section 564(b)(1) of the Act, 21  U.S.C. section 360bbb-3(b)(1), unless the authorization is  terminated or revoked. Performed at [facility], [place] [address] [place], [place] [address]      Discharge Instructions:   Discharge Instructions    Call MD for:  persistant dizziness or light-headedness   Complete by: As directed    Call MD for:  severe uncontrolled pain   Complete by: As directed    Call MD for:  temperature >100.4   Complete by: As directed    Diet - low sodium heart healthy   Complete by: As directed    Discharge instructions   Complete by: As directed    Follow up with neurology as scheduled OP speech therapy will be arranged Take medications as prescribed   Increase activity slowly    Complete by: As directed      Allergies as of 10/24/2019   No Known Allergies     Medication List    STOP taking these medications   clotrimazole-betamethasone cream Commonly known as: Lotrisone   omeprazole 20 MG capsule Commonly known as: PRILOSEC   predniSONE 20 MG tablet Commonly known as: DELTASONE     TAKE these medications   aspirin 81 MG EC tablet Take 1 tablet (81 mg total) by mouth daily. Start taking on: October 25, 2019   atorvastatin 40 MG tablet Commonly known as: LIPITOR Take 1 tablet (40 mg total) by mouth daily at 6 PM.   clopidogrel 75 MG tablet Commonly known as: PLAVIX Take 1 tablet (75 mg total) by mouth daily for 21 days. Start taking on: October 25, 2019   ibuprofen 200 MG tablet Commonly known as: ADVIL Take 200-600 mg by mouth every 6 (six) hours as needed for headache or mild pain.         Time coordinating discharge: 82 minites  Signed:  Radene Gunning NP  [facility] 10/24/2019, 2:06 PM

## 2019-10-24 NOTE — Evaluation (Signed)
Physical Therapy Evaluation Patient Details Name: Jeffrey [name] MRN: [id number] DOB: September 17, 1960 Today's Date: 10/24/2019   History of Present Illness  Jeffrey [name] is a 59 y.o. male with no significant past medical history started developing sudden onset of left facial droop and difficulty speaking. MRI showing acute infartction in the right basal ganglia/posterior limb internal capsule.  Clinical Impression  Patient evaluated by Physical Therapy with no further acute PT needs identified. Pt displays continued left facial droop and slurred speech. Ambulating hallway distances and negotiating steps independently. BP 115/92 post mobility. Scoring 24/24 on the Dynamic Gait Index, indicating he is not at risk for falls. All education has been completed and the patient has no further questions. No follow-up Physical Therapy or equipment needs. PT is signing off. Thank you for this referral.     Follow Up Recommendations No PT follow up    Equipment Recommendations  None recommended by PT    Recommendations for Other Services       Precautions / Restrictions Precautions Precautions: None Restrictions Weight Bearing Restrictions: No      Mobility  Bed Mobility Overal bed mobility: Independent                Transfers Overall transfer level: Independent Equipment used: None                Ambulation/Gait Ambulation/Gait assistance: Independent Gait Distance (Feet): 400 Feet Assistive device: None Gait Pattern/deviations: WFL(Within Functional Limits)   Gait velocity interpretation: >4.37 ft/sec, indicative of normal walking speed    Stairs Stairs: Yes Stairs assistance: Independent Stair Management: No rails Number of Stairs: 5    Wheelchair Mobility    Modified Rankin (Stroke Patients Only) Modified Rankin (Stroke Patients Only) Pre-Morbid Rankin Score: No symptoms Modified Rankin: No significant disability     Balance Overall balance  assessment: No apparent balance deficits (not formally assessed)                               Standardized Balance Assessment Standardized Balance Assessment : Dynamic Gait Index   Dynamic Gait Index Level Surface: Normal Change in Gait Speed: Normal Gait with Horizontal Head Turns: Normal Gait with Vertical Head Turns: Normal Gait and Pivot Turn: Normal Step Over Obstacle: Normal Step Around Obstacles: Normal Steps: Normal Total Score: 24       Pertinent Vitals/Pain Pain Assessment: No/denies pain    Home Living Family/patient expects to be discharged to:: Private residence Living Arrangements: Spouse/significant other Available Help at Discharge: Family Type of Home: House Home Access: Stairs to enter   Technical brewer of Steps: 2 Home Layout: Two level        Prior Function Level of Independence: Independent         Comments: Community ambulatory, works an office job     Journalist, newspaper        Extremity/Trunk Assessment   Upper Extremity Assessment Upper Extremity Assessment: RUE deficits/detail;LUE deficits/detail RUE Deficits / Details: Strength 5/5 RUE Sensation: WNL LUE Deficits / Details: Strength 5/5 LUE Sensation: WNL    Lower Extremity Assessment Lower Extremity Assessment: RLE deficits/detail;LLE deficits/detail RLE Deficits / Details: Strength 5/5 LLE Deficits / Details: Strength 5/5    Cervical / Trunk Assessment Cervical / Trunk Assessment: Normal  Communication   Communication: Expressive difficulties  Cognition Arousal/Alertness: Awake/alert Behavior During Therapy: WFL for tasks assessed/performed Overall Cognitive Status: Within Functional Limits for tasks assessed  General Comments      Exercises     Assessment/Plan    PT Assessment Patent does not need any further PT services  PT Problem List         PT Treatment Interventions      PT  Goals (Current goals can be found in the Care Plan section)  Acute Rehab PT Goals Patient Stated Goal: none stated; agreeable to evaluation PT Goal Formulation: All assessment and education complete, DC therapy    Frequency     Barriers to discharge        Co-evaluation               AM-PAC PT "6 Clicks" Mobility  Outcome Measure Help needed turning from your back to your side while in a flat bed without using bedrails?: None Help needed moving from lying on your back to sitting on the side of a flat bed without using bedrails?: None Help needed moving to and from a bed to a chair (including a wheelchair)?: None Help needed standing up from a chair using your arms (e.g., wheelchair or bedside chair)?: None Help needed to walk in hospital room?: None Help needed climbing 3-5 steps with a railing? : None 6 Click Score: 24    End of Session   Activity Tolerance: Patient tolerated treatment well Patient left: Other (comment)(standing in room; independent) Nurse Communication: Mobility status PT Visit Diagnosis: Other symptoms and signs involving the nervous system [id number])    Time: [id number] PT Time Calculation (min) (ACUTE ONLY): 13 min   Charges:   PT Evaluation $PT Eval Low Complexity: [place], PT, DPT Acute Rehabilitation Services Pager [phone] Office [phone]   Deno Etienne 10/24/2019, 9:56 AM

## 2019-10-24 NOTE — Evaluation (Signed)
Speech Language Pathology Evaluation Patient Details Name: Jeffrey [name] MRN: [id number] DOB: [birth year] Today's Date: 10/24/2019 Time: [id number] SLP Time Calculation (min) (ACUTE ONLY): 17 min  Problem List:  Patient Active Problem List   Diagnosis Date Noted  . Acute CVA (cerebrovascular accident) ([place]) 10/23/2019  . Lower back pain 08/01/2019  . Dermatitis due to sun 02/07/2018  . Angular cheilosis 01/21/2018  . GASTROESOPHAGEAL REFLUX DISEASE 10/25/2007  . COLONIC POLYPS, ADENOMATOUS 08/20/2007  . ESOPHAGEAL STRICTURE 08/20/2007  . HIATAL HERNIA 08/20/2007   Past Medical History:  Past Medical History:  Diagnosis Date  . Acute CVA (cerebrovascular accident) ([place])   . Allergy   . Colonic polyp    adenomatous  . Esophageal stricture   . GERD (gastroesophageal reflux disease)   . Hemorrhage, anal or rectal   . Hiatal hernia   . Reflux esophagitis    Past Surgical History:  Past Surgical History:  Procedure Laterality Date  . COLONOSCOPY    . polyectomy  2008 & 2014  . POLYPECTOMY    . UPPER GASTROINTESTINAL ENDOSCOPY     HPI:  Jeffrey [name] is a 59 y.o. male with no significant past medical history started developing sudden onset of left facial droop and difficulty speaking. MRI showing acute infartction in the right basal ganglia/posterior limb internal capsule.   Assessment / Plan / Recommendation Clinical Impression   Patient received at bedside for speech and language evaluation. Patient presents with moderate dysarthria in the setting of acute basal ganglia infarct. Patient with reduced intelligibility characterized by articulatory imprecision and distortions. Intelligibility approximately 50% at the conversation level, 60% at the 5-7 word sentence level and 75% at the word level. Patient appearing lethargic which may have affected his performance during this assessment.  No evidence of aphasia, expressive and receptive language [name]. Pt able to follow simple  and complex verbal commands, answer basic/complex yes/no questions, engage in automatic naming and confrontation naming tasks without difficulty. Pt denies word-finding trouble. No evidence of apraxia of speech or motor speech impairment. Informally, cognitive-linguistic skills appear to be [name]. Pt oriented x4.  Patient able to engage in conversation, recall 4 words in delayed recall task and demonstrated appropriate awareness and insight into his situation. Informally, patient demonstrated ability to read information on white board in his room. Patient denies swallowing difficulties.  Recommend ST targeting dysarthria while in-house and follow up ST at the next venue of care.     SLP Assessment  SLP Recommendation/Assessment: Patient needs continued Speech Lanaguage Pathology Services SLP Visit Diagnosis: Dysarthria and anarthria (R47.1)    Follow Up Recommendations  Outpatient SLP    Frequency and Duration min 2x/week  2 weeks      SLP Evaluation Cognition  Overall Cognitive Status: Within Functional Limits for tasks assessed Arousal/Alertness: (tired) Orientation Level: Oriented X4 Attention: Focused Focused Attention: Appears intact Memory: Appears intact Awareness: Appears intact Problem Solving: Appears intact       Comprehension  Auditory Comprehension Overall Auditory Comprehension: Appears within functional limits for tasks assessed Yes/No Questions: Within Functional Limits Commands: Within Functional Limits Conversation: Complex Visual Recognition/Discrimination Discrimination: Not tested Reading Comprehension Reading Status: Not tested    Expression Expression Primary Mode of Expression: Verbal Verbal Expression Overall Verbal Expression: Appears within functional limits for tasks assessed Written Expression Dominant Hand: Right Written Expression: Not tested   Oral / Motor  Motor Speech Overall Motor Speech: Impaired Respiration: Within functional  limits Phonation: Normal Resonance: Within functional limits Articulation: Impaired Level of Impairment: Phrase  Intelligibility: Intelligibility reduced Word: 50-74% accurate Phrase: 50-74% accurate Sentence: 50-74% accurate Conversation: 50-74% accurate Motor Planning: Witnin functional limits Motor Speech Errors: Not applicable   GO                    Jeffrey [name] 10/24/2019, 10:49 AM [name], M.Ed., [place] Speech Therapy Acute Rehabilitation [phone]: Acute Rehab office [phone] - pager

## 2019-10-24 NOTE — Progress Notes (Signed)
Pt  Off unit to MRI via bed.

## 2019-10-31 ENCOUNTER — Encounter: Payer: Self-pay | Admitting: Internal Medicine

## 2019-10-31 NOTE — Patient Instructions (Addendum)
   Medications reviewed and updated.  Changes include :   none    Schedule with neurology and speech therapy.     Please followup in 6 months

## 2019-10-31 NOTE — Progress Notes (Signed)
Subjective:    Patient ID: Jeffrey [name], male    DOB: 1960/10/02, 59 y.o.   MRN: [id number]  HPI The patient is here for follow up from the hospital.  Admitted 10/23/19-10/24/19 for CVA   He developed sudden onset of left facial droop and difficulty speaking and 2/11.  His speech was blurred.  This started around 3:30 in the afternoon.  He was brought to the emergency room.  He was not experiencing any weakness in his extremities, difficulty swallowing or changes in vision.  Code stroke was called.  CT of the head, CT angio of the head and neck were done and neurology was consulted.  Stroke was confirmed.  He was not felt to be a candidate for TPA given deficits were minimal.  EKG showed normal sinus rhythm.  Covid test was negative.  Labs were unremarkable.  Acute CVA: CT of the head: Negative, bilateral sinus disease CT angio of head and neck: Normal MRI showed acute infarction of right basal ganglia/posterior limb internal capsule Evaluated by neurology-TPA not done secondary to minimal symptoms EKG normal sinus rhythm Echo with EF 60-65%, normal LV function Lipid panel within normal limits, A1c normal Atorvastatin 40 mg daily started Evaluated by PT, OT and ST Speech therapy recommended outpatient speech therapy Neurology advised aspirin and Plavix for 3 weeks then aspirin alone  GERD: Stable  Lower back pain: Stable   His left hand has decreased dexterity.  He still has slurred speech, but it is better.  It is worse when he tries to speak fast.  He still has mild left facial droop, but again it is better.  He is taking all his medications as prescribed.  He denies side effects.     He did receive a call from neurology and speech but has not set up an appointment yet.   He has noticed urinary urgency which is new. He wondered if that related to the stroke.  He has been drinking more fluids. He denies dysuria and hematuria.    Medications and allergies reviewed with  patient and updated if appropriate.  Patient Active Problem List   Diagnosis Date Noted  . CVA (cerebral vascular accident) ([place]) 10/24/2019  . Acute CVA (cerebrovascular accident) ([place]) 10/23/2019  . Lower back pain 08/01/2019  . Dermatitis due to sun 02/07/2018  . Angular cheilosis 01/21/2018  . GASTROESOPHAGEAL REFLUX DISEASE 10/25/2007  . COLONIC POLYPS, ADENOMATOUS 08/20/2007  . ESOPHAGEAL STRICTURE 08/20/2007  . HIATAL HERNIA 08/20/2007    Current Outpatient Medications on File Prior to Visit  Medication Sig Dispense Refill  . aspirin EC 81 MG EC tablet Take 1 tablet (81 mg total) by mouth daily.    Marland Kitchen atorvastatin (LIPITOR) 40 MG tablet Take 1 tablet (40 mg total) by mouth daily at 6 PM. 30 tablet 1  . clopidogrel (PLAVIX) 75 MG tablet Take 1 tablet (75 mg total) by mouth daily for 21 days. 21 tablet 0   No current facility-administered medications on file prior to visit.    Past Medical History:  Diagnosis Date  . Acute CVA (cerebrovascular accident) ([place])   . Allergy   . Colonic polyp    adenomatous  . Esophageal stricture   . GERD (gastroesophageal reflux disease)   . Hemorrhage, anal or rectal   . Hiatal hernia   . Reflux esophagitis     Past Surgical History:  Procedure Laterality Date  . COLONOSCOPY    . polyectomy  2008 & 2014  . POLYPECTOMY    .  UPPER GASTROINTESTINAL ENDOSCOPY      Social History   Socioeconomic History  . Marital status: Married    Spouse name: Not on file  . Number of children: Not on file  . Years of education: Not on file  . Highest education level: Not on file  Occupational History  . Not on file  Tobacco Use  . Smoking status: Never Smoker  . Smokeless tobacco: Current User    Types: Snuff  Substance and Sexual Activity  . Alcohol use: Yes    Comment: occasionally  . Drug use: No  . Sexual activity: Not on file  Other Topics Concern  . Not on file  Social History Narrative   [employer] softball a lot      No  regular exercise   Social Determinants of Health   Financial Resource Strain:   . Difficulty of Paying Living Expenses: Not on file  Food Insecurity:   . Worried About Charity fundraiser in the Last Year: Not on file  . Ran Out of Food in the Last Year: Not on file  Transportation Needs:   . Lack of Transportation (Medical): Not on file  . Lack of Transportation (Non-Medical): Not on file  Physical Activity:   . Days of Exercise per Week: Not on file  . Minutes of Exercise per Session: Not on file  Stress:   . Feeling of Stress : Not on file  Social Connections:   . Frequency of Communication with Friends and Family: Not on file  . Frequency of Social Gatherings with Friends and Family: Not on file  . Attends Religious Services: Not on file  . Active Member of Clubs or Organizations: Not on file  . Attends Archivist Meetings: Not on file  . Marital Status: Not on file    Family History  Problem Relation Age of Onset  . Colon cancer Neg Hx   . Stomach cancer Neg Hx   . Esophageal cancer Neg Hx   . Rectal cancer Neg Hx   . Liver cancer Neg Hx   . Pancreatic cancer Neg Hx     Review of Systems  Constitutional: Negative for chills and fever.  HENT: Negative for trouble swallowing.   Eyes: Negative for visual disturbance.  Respiratory: Negative for cough, shortness of breath and wheezing.   Cardiovascular: Negative for chest pain, palpitations and leg swelling.  Gastrointestinal: Negative for abdominal pain and nausea.  Genitourinary: Positive for urgency. Negative for dysuria and hematuria.  Neurological: Positive for facial asymmetry, speech difficulty and weakness (dec dexterity in left hand). Negative for dizziness, light-headedness, numbness and headaches.       Objective:   Vitals:   11/03/19 1301  BP: 126/84  Pulse: 65  Resp: 16  Temp: 97.9 F (36.6 C)  SpO2: 99%   BP Readings from Last 3 Encounters:  11/03/19 126/84  10/24/19 (!) 129/91    08/01/19 120/90   Wt Readings from Last 3 Encounters:  11/03/19 167 lb (75.8 kg)  10/23/19 157 lb 13.6 oz (71.6 kg)  08/01/19 162 lb (73.5 kg)   Body mass index is 25.39 kg/m.   Physical Exam Constitutional:      General: He is not in acute distress.    Appearance: Normal appearance. He is not ill-appearing.  HENT:     Head: Normocephalic and atraumatic.  Cardiovascular:     Rate and Rhythm: Normal rate and regular rhythm.     Heart sounds: No murmur.  Pulmonary:  Effort: Pulmonary effort is normal. No respiratory distress.     Breath sounds: Normal breath sounds. No wheezing or rales.  Musculoskeletal:     Right lower leg: No edema.     Left lower leg: No edema.  Skin:    General: Skin is warm and dry.  Neurological:     Mental Status: He is alert and oriented to person, place, and time.     Cranial Nerves: Cranial nerve deficit (left facial droop - minimal) present.     Sensory: No sensory deficit.     Motor: Weakness (minimal left hand weakness) present.     Comments: Slurred speech - mild  Psychiatric:        Mood and Affect: Mood normal.        Behavior: Behavior normal.        Thought Content: Thought content normal.        Judgment: Judgment normal.         Lab Results  Component Value Date   WBC 7.4 10/23/2019   HGB 16.2 10/23/2019   HCT 45.5 10/23/2019   PLT 224 10/23/2019   GLUCOSE 104 (H) 10/23/2019   CHOL 166 10/24/2019   TRIG 141 10/24/2019   HDL 39 (L) 10/24/2019   LDLDIRECT 116.0 07/03/2016   LDLCALC 99 10/24/2019   ALT 17 10/23/2019   AST 20 10/23/2019   NA 137 10/23/2019   K 4.2 10/23/2019   CL 101 10/23/2019   CREATININE 1.02 10/23/2019   BUN 10 10/23/2019   CO2 23 10/23/2019   TSH 2.28 01/25/2018   PSA 0.4 07/03/2016   INR 1.0 10/23/2019   HGBA1C 5.0 10/24/2019    ECHOCARDIOGRAM COMPLETE    ECHOCARDIOGRAM REPORT       Patient Name:   HOMER CHRISTOPHER Date of Exam: 10/24/2019 Medical Rec #:  [id number]        Height:        68.0 in Accession #:    [id number]       Weight:       157.8 lb Date of Birth:  [birth year]        BSA:          1.85 m Patient Age:    74 years         BP:           133/74 mmHg Patient Gender: M                HR:           57 bpm. Exam Location:  Inpatient  Procedure: 2D Echo  Indications:    Stroke I163.9   History:        Patient has no prior history of Echocardiogram examinations.   Sonographer:    Mikki Santee RDCS (AE) Referring Phys: [place]   1. No obvious source of embolus Bubble study not performed.  2. Left ventricular ejection fraction, by estimation, is 60 to 65%. The left ventricle has normal function. The left ventricle has no regional wall motion abnormalities. Left ventricular diastolic parameters were normal.  3. Right ventricular systolic function is normal. The right ventricular size is normal.  4. Redundant subvalvular chordae tendina. The mitral valve is normal in structure and function. No evidence of mitral valve regurgitation. No evidence of mitral stenosis.  5. The aortic valve is normal in structure and function. Aortic valve regurgitation is not visualized. No aortic stenosis is present.  FINDINGS  Left Ventricle:  Left ventricular ejection fraction, by estimation, is 60 to 65%. The left ventricle has normal function. The left ventricle has no regional wall motion abnormalities. The left ventricular internal cavity size was normal in size. There is  no left ventricular hypertrophy. Left ventricular diastolic parameters were normal.  Right Ventricle: The right ventricular size is normal. No increase in right ventricular wall thickness. Right ventricular systolic function is normal.  Left Atrium: Left atrial size was normal in size.  Right Atrium: Right atrial size was normal in size.  Pericardium: There is no evidence of pericardial effusion.  Mitral Valve: Redundant subvalvular chordae tendina. The mitral valve is  normal in structure and function. Normal mobility of the mitral valve leaflets. No evidence of mitral valve regurgitation. No evidence of mitral valve stenosis.  Tricuspid Valve: The tricuspid valve is normal in structure. Tricuspid valve regurgitation is trivial. No evidence of tricuspid stenosis.  Aortic Valve: The aortic valve is normal in structure and function. Aortic valve regurgitation is not visualized. No aortic stenosis is present.  Pulmonic Valve: The pulmonic valve was normal in structure. Pulmonic valve regurgitation is trivial. No evidence of pulmonic stenosis.  Aorta: The aortic root is normal in size and structure.  IAS/Shunts: No atrial level shunt detected by color flow Doppler.  Additional Comments: No obvious source of embolus Bubble study not performed.    LEFT VENTRICLE PLAX 2D LVIDd:         4.63 cm  Diastology LVIDs:         2.90 cm  LV e' lateral:   12.00 cm/s LV PW:         1.00 cm  LV E/e' lateral: 4.5 LV IVS:        0.94 cm  LV e' medial:    11.10 cm/s LVOT diam:     2.10 cm  LV E/e' medial:  4.8 LV SV:         84.51 ml LV SV Index:   35.82 LVOT Area:     3.46 cm    RIGHT VENTRICLE RV S prime:     15.70 cm/s TAPSE (M-mode): 1.9 cm  LEFT ATRIUM           Index       RIGHT ATRIUM           Index LA diam:      3.30 cm 1.79 cm/m  RA Area:     14.70 cm LA Vol (A4C): 30.6 ml 16.56 ml/m RA Volume:   37.70 ml  20.40 ml/m  AORTIC VALVE LVOT Vmax:   112.00 cm/s LVOT Vmean:  75.400 cm/s LVOT VTI:    0.244 m   AORTA Ao Root diam: 3.00 cm  MITRAL VALVE MV Area (PHT): 1.72 cm    SHUNTS MV Decel Time: 440 msec    Systemic VTI:  0.24 m MV E velocity: 53.60 cm/s  Systemic Diam: 2.10 cm MV A velocity: 45.90 cm/s MV E/A ratio:  1.17  Jenkins Rouge MD Electronically signed by Jenkins Rouge MD Signature Date/Time: 10/24/2019/10:45:34 AM      Final   MR BRAIN WO CONTRAST CLINICAL DATA:  Slurred speech. Question stroke. Left-sided  facial droop.  EXAM: MRI HEAD WITHOUT CONTRAST  TECHNIQUE: Multiplanar, multiecho pulse sequences of the brain and surrounding structures were obtained without intravenous contrast.  COMPARISON:  CT studies 10/23/2019  FINDINGS: Brain: Diffusion imaging shows an acute infarction in the right basal ganglia/posterior limb internal capsule. This measures maximally 13 mm in size. No  other acute infarction. Elsewhere, the brainstem and cerebellum are normal. Minimal small-vessel changes seen elsewhere within the cerebral hemispheres. No large vessel territory infarction. No mass lesion, hemorrhage, hydrocephalus or extra-axial collection.  Vascular: Major vessels at the base of the brain show flow.  Skull and upper cervical spine: Negative  Sinuses/Orbits: Mucosal inflammatory changes of the paranasal sinuses. Chronically small maxillary sinuses. Orbits negative.  Other: None  IMPRESSION: Acute infarction in the right basal ganglia/posterior limb internal capsule. Maximal dimension 13 mm. No hemorrhage.  Electronically Signed   By: Nelson Chimes M.D.   On: 10/24/2019 01:42    Assessment & Plan:    See Problem List for Assessment and Plan of chronic medical problems.    This visit occurred during the SARS-CoV-2 public health emergency.  Safety protocols were in place, including screening questions prior to the visit, additional usage of staff PPE, and extensive cleaning of exam room while observing appropriate contact time as indicated for disinfecting solutions.

## 2019-11-03 ENCOUNTER — Encounter: Payer: Self-pay | Admitting: Internal Medicine

## 2019-11-03 ENCOUNTER — Ambulatory Visit (INDEPENDENT_AMBULATORY_CARE_PROVIDER_SITE_OTHER): Payer: Managed Care, Other (non HMO) | Admitting: Internal Medicine

## 2019-11-03 ENCOUNTER — Other Ambulatory Visit: Payer: Self-pay

## 2019-11-03 VITALS — BP 126/84 | HR 65 | Temp 97.9°F | Resp 16 | Ht 68.0 in | Wt 167.0 lb

## 2019-11-03 DIAGNOSIS — I639 Cerebral infarction, unspecified: Secondary | ICD-10-CM

## 2019-11-03 NOTE — Assessment & Plan Note (Addendum)
Acute, symptoms have improved since d/c Has mild left facial droop, slurred speech and left hand weakness - mostly decreased dexterity Will schedule with neuro, speech Doing hand exercises ASA 81 mg daily, plavix daily x 3 months then stop Started on atorvastatin 40 mg daily - lifelong - continue - I will fill BP good Fu in 6 months

## 2019-11-04 ENCOUNTER — Other Ambulatory Visit: Payer: Self-pay

## 2019-11-04 ENCOUNTER — Ambulatory Visit: Payer: Managed Care, Other (non HMO) | Attending: Internal Medicine

## 2019-11-04 DIAGNOSIS — R471 Dysarthria and anarthria: Secondary | ICD-10-CM | POA: Insufficient documentation

## 2019-11-04 NOTE — Therapy (Signed)
[facility] [address] [place], [place], [address] Phone: [phone]   Fax:  [phone]  Speech Language Pathology Evaluation  Patient Details  Name: Mr. Renno MRN: [id number] Date of Birth: 07-16-1961 Referring Provider (SLP): Eulogio Bear DO (however doc going to PCP Billey Gosling, MD)   Encounter Date: 11/04/2019  End of Session - 11/04/19 1722    Visit Number  1    Number of Visits  17    Date for SLP Re-Evaluation  01/02/20    SLP Start Time  0850    SLP Stop Time   0930    SLP Time Calculation (min)  40 min    Activity Tolerance  Patient tolerated treatment well       Past Medical History:  Diagnosis Date  . Acute CVA (cerebrovascular accident) ([place])   . Allergy   . Colonic polyp    adenomatous  . Esophageal stricture   . GERD (gastroesophageal reflux disease)   . Hemorrhage, anal or rectal   . Hiatal hernia   . Reflux esophagitis     Past Surgical History:  Procedure Laterality Date  . COLONOSCOPY    . polyectomy  2008 & 2014  . POLYPECTOMY    . UPPER GASTROINTESTINAL ENDOSCOPY      There were no vitals filed for this visit.  Subjective Assessment - 11/04/19 0856    Subjective  "I still notice that when I get a little too excited - it get s a little slurred. If I slow things down my enunciation is way, way better."    Currently in Pain?  No/denies         [facility] - 11/04/19 [id number]      SLP Visit Information   SLP Received On  11/04/19    Referring Provider (SLP)  Eulogio Bear DO (however doc going to PCP Billey Gosling, MD)    Onset Date  10-23-19    Medical Diagnosis  Basal Ganglia CVA      Subjective   Patient/Family Stated Goal  "I'm looking back to getting things back to normal again."      General Information   HPI  Pt is a 59 y.o. male with no significant past medical history who suddenly developed onset of left facial droop and difficulty speaking. MRI  showed acute infartction in the right basal ganglia/posterior limb internal capsule. Pt reports his speech is better and improving daily but is still not back to normal.     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     Cognition   Overall Cognitive Status  Within Functional Limits for tasks assessed      Auditory Comprehension   Overall Auditory Comprehension  Appears within functional limits for tasks assessed      Verbal Expression   Overall Verbal Expression  Appears within functional limits for tasks assessed      Oral Motor/Sensory Function   Overall Oral Motor/Sensory Function  Impaired  Labial ROM  Within Functional Limits    Labial Symmetry  Abnormal symmetry left    Labial Strength  Reduced Left    Labial Coordination  WFL   not WNL   Lingual ROM  Within Functional Limits    Lingual Strength  Reduced Left    Lingual Coordination  Reduced      Motor Speech   Overall Motor Speech  Impaired    Respiration  Within functional limits    Phonation  Normal    Resonance  --   possibly mild hypernasality(?)   Articulation  Impaired    Level of Impairment  Sentence    Intelligibility  Intelligibility reduced    Conversation  --   95%+ with careful listening and rare req for repeat   Motor Planning  Witnin functional limits                      SLP Education - 11/04/19 1721    Education Details  HEP (tongue twisters and functional lingual/labial exercises), intelligibliity strategies    Person(s) Educated  Patient    Methods  Explanation;Demonstration;Handout;Verbal cues     Comprehension  Verbalized understanding;Returned demonstration;Verbal cues required;Need further instruction       SLP Short Term Goals - 11/04/19 1727      SLP SHORT TERM GOAL #1   Title  pt iwll perform HEP for overarticulation with rare min A in two sessions    Time  2    Period  Weeks    Status  New      SLP SHORT TERM GOAL #2   Title  pt will perform HEP for lingual and labial strengtheing with rare min A in 2 sessions    Time  2    Period  Weeks    Status  New      SLP SHORT TERM GOAL #3   Title  pt will use speech intelligiblity strategies to improve intelligilibty to ease listener burden in 10 minutes mod complex conversation in 3 sessions    Time  4    Period  Weeks    Status  New       SLP Long Term Goals - 11/04/19 1734      SLP LONG TERM GOAL #1   Title  pt will use speech intelligiblity strategies to improve intelligilibty to 100% in 10 minutes mod complex-complex conversation or work-like tasks (e.g., presentations) in 3 sessions    Time  8    Period  Weeks   or 17 total sessions, for all LTGs     SLP LONG TERM GOAL #2   Title  pt will complete HEP for oral strengthening and for speech intelligbility strategies with independence over 3 sessions    Time  8    Period  Weeks    Status  New       Plan - 11/04/19 1722    Clinical Impression Statement  Pt presents today with mild-mod dysarthria after a rt basal ganglia CVA almost two weeks ago. Pt denies overt s/sx dysphagia except more frequent anterior labial leakage when drinking or brushing teeth, until approx 2-3 days ago - now approximating WNL but not yet at baseline. SLP required to engage in careful listening 30% of the time in picture description tasks, and approx 15% of the time in conversation due to imprecise speech and pt lack of spontaneous compensatory strategies to improve his speech intelligibliity in conversation. 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 Pt appears highly motivated for improving his speech intelligibility.    Speech Therapy Frequency  2x / week    Duration  --   8 weeks, or 17 total sessions   Treatment/Interventions  Oral motor exercises;Functional tasks;Compensatory techniques;SLP instruction and feedback;Patient/family education;Internal/external aids    Potential to Achieve Goals  Good    SLP Home Exercise Plan  provided today    Consulted and Agree with Plan of Care  Patient       Patient will benefit from skilled therapeutic intervention in order to improve the following deficits and impairments:   Dysarthria and anarthria    Problem List Patient Active Problem List   Diagnosis Date Noted  . CVA (cerebral vascular accident) ([place]) 10/24/2019  . Acute CVA (cerebrovascular accident) ([place]) 10/23/2019  . Lower back pain 08/01/2019  . Dermatitis due to sun 02/07/2018  . Angular cheilosis 01/21/2018  . GASTROESOPHAGEAL REFLUX DISEASE 10/25/2007  . COLONIC POLYPS, ADENOMATOUS 08/20/2007  . ESOPHAGEAL STRICTURE 08/20/2007  . HIATAL HERNIA 08/20/2007    [facility] ,MS, CCC-SLP  11/04/2019, 5:36 PM  [facility] [address] [place], [place], [address] Phone: [phone]   Fax:  [phone]  Name: Jahvier Walston MRN: [id number] Date of Birth: Mar 13, 1961

## 2019-11-04 NOTE — Patient Instructions (Signed)
              Speech Exercises    SLOW, OPEN YOUR MOUTH, PAUSE MORE AS YOU TALK            Say 2 times, 2-3 times a day  Call the cat "Buttercup" A calendar of [place], [place] Four floors to cover Yellow oil ointment Fellow lovers of felines Catastrophe in Donnellson' plums The church's chimes chimed Telling time 'til eleven Five valve levers Keep the gate closed Go see that guy Fat cows give milk [employer] Gophers Fat frogs flip freely Kohl's into bed Get that game to [employer] Thick thistles stick together Cinnamon aluminum linoleum Black bugs blood Lovely lemon linament Red leather, yellow leather  Big grocery buggy    Purple baby carriage [facility] copper coffee pot Ripe purple cabbage Three free throws [employer] tackled  [employer] dipped the dessert  [facility] [place] that [employer] of [employer] Shirts shrink, shells shouldn't [place] 49ers Take the tackle box File the flash message Give me five flapjacks Fundamental relatives Dye the pets purple Talking [place] time after time Dark chocolate chunks Political landscape of the kingdom Estate manager/land agent genius We played yo-yos yesterday Unique [place] An Chief Financial Officer employed by [employer]  ======================================  LIP and TONGUE exercises  --- DO ALL EXERCISES TWICE EACH DAY  1. Lip press - Press your lips together firmly (like making a /m/ sound) and hold 5 seconds -repeat 10 times  2. LOUD and LONG [place] - make a kissing sound as loud as you can, as long as you can - repeat 10 times  3. Tongue sweep - Sweep your tongue in the pockets of your mouth - touch every tooth  - ten revolutions each way  4. PUH! - 20 times       MAKE THE SOUNDS STRONG      TUH - 20 times      KUH! - 20 times  5. Move a licorice stick, straw, or a toothpick from side to side in your mouth - 10 back and forth  movements  6. Say "OOOOO", and "EEEEE" 10 times (Kiss and smile)

## 2019-11-07 ENCOUNTER — Telehealth: Payer: Self-pay

## 2019-11-07 DIAGNOSIS — I63311 Cerebral infarction due to thrombosis of right middle cerebral artery: Secondary | ICD-10-CM

## 2019-11-07 NOTE — Telephone Encounter (Signed)
Patient calling and states that he is requesting a referral to a Vascular Surgeon. States that he has a recent stroke on 10/23/2019.  Would like to see Dr Hinton Lovely with [name] [phone]  CB#: [phone] Patient would like a call once referral had been placed.

## 2019-11-08 NOTE — Telephone Encounter (Signed)
Not sure if vascular can help but I will order referral

## 2019-11-14 ENCOUNTER — Telehealth: Payer: Self-pay | Admitting: Internal Medicine

## 2019-11-14 NOTE — Telephone Encounter (Signed)
Pt has had an episode of having cold sensation with his arm and is wondering if it could be a side effect from his medication. (He stated that he is not feeling numbness in his arm, just cold) Can you please give him a call regarding this

## 2019-11-14 NOTE — Telephone Encounter (Signed)
This is not a side effect from the medication.  Not sure what is causing him since this is the opposite side of his stroke.  Should come in if this does not get better or gets worse.

## 2019-11-14 NOTE — Telephone Encounter (Signed)
Called patient in regards to get more information. Stats that he gets cold sensation in his right arm at times. Wondered if it could be a side effect from a medication. States it does not have any pain, numbness or tingling. He also stated that it is not constant. I advised it is hard to know what is going on without being evaluated. He stated that he will come in if things do not get any better or they get worse. He would like to see how he does.

## 2019-11-17 ENCOUNTER — Other Ambulatory Visit: Payer: Self-pay

## 2019-11-17 ENCOUNTER — Ambulatory Visit: Payer: Managed Care, Other (non HMO) | Attending: Internal Medicine

## 2019-11-17 DIAGNOSIS — R471 Dysarthria and anarthria: Secondary | ICD-10-CM | POA: Insufficient documentation

## 2019-11-17 NOTE — Therapy (Signed)
[facility] [address] [place], [place], [address] Phone: [phone]   Fax:  [phone]  Speech Language Pathology Treatment  Patient Details  Name: [name] MRN: [id number] Date of Birth: 07/01/1961 Referring Provider (SLP): Eulogio Bear DO (however doc going to PCP Billey Gosling, MD)   Encounter Date: 11/17/2019  End of Session - 11/17/19 1402    Visit Number  2    Number of Visits  17    Date for SLP Re-Evaluation  01/02/20    SLP Start Time  1320    SLP Stop Time   1358    SLP Time Calculation (min)  38 min    Activity Tolerance  Patient tolerated treatment well       Past Medical History:  Diagnosis Date  . Acute CVA (cerebrovascular accident) ([place])   . Allergy   . Colonic polyp    adenomatous  . Esophageal stricture   . GERD (gastroesophageal reflux disease)   . Hemorrhage, anal or rectal   . Hiatal hernia   . Reflux esophagitis     Past Surgical History:  Procedure Laterality Date  . COLONOSCOPY    . polyectomy  2008 & 2014  . POLYPECTOMY    . UPPER GASTROINTESTINAL ENDOSCOPY      There were no vitals filed for this visit.  Subjective Assessment - 11/17/19 1323    Subjective  "Getting the house painted. Every time I go look for something I find it's through the plastic."    Currently in Pain?  No/denies            ADULT SLP TREATMENT - 11/17/19 1324      General Information   Behavior/Cognition  Alert;Cooperative;Pleasant mood      Treatment Provided   Treatment provided  Cognitive-Linquistic      Cognitive-Linquistic Treatment   Treatment focused on  Dysarthria    Skilled Treatment  "My wife tells me still, I have to slow down." Pt described the process around his position at work for 6 minutes with fast rate and WNL articulation. SLP inquired of pt if a faster rate was his WNL and pt stated faster rate is his WNL. SLP suggested pt think about talking 0.8 speed during  theday and that would allow him to go longer into the evening compared to going 1.0 speed and "hitting a brick wall at 6pm." Pt to bring a presentation from work next session to do in front of SLP next session. SLP And pt walked outside and pt was told to focus on slowoing his rate to 0.8 speed. SLP had to provide rare min-mod cue x2 for pt to remain slower rate in a simple 10 minute conversation.  SLP educated pt re: externa cue to reduce rate throughout the day.  Pt 95% intelligibile today during the session.      Assessment / Recommendations / Plan   Plan  Continue with current plan of care      Progression Toward Goals   Progression toward goals  Progressing toward goals       SLP Education - 11/17/19 1401    Education Details  external cues for rate reduction    Person(s) Educated  Patient    Methods  Explanation    Comprehension  Verbalized understanding       SLP Short Term Goals - 11/17/19 1404      SLP SHORT TERM GOAL #1   Title  pt iwll perform HEP for overarticulation  with rare min A in two sessions    Time  2    Period  Weeks    Status  On-going      SLP SHORT TERM GOAL #2   Title  pt will perform HEP for lingual and labial strengtheing with rare min A in 2 sessions    Time  2    Period  Weeks    Status  On-going      SLP SHORT TERM GOAL #3   Title  pt will use speech intelligiblity strategies to improve intelligilibty to ease listener burden in 10 minutes mod complex conversation in 3 sessions    Time  4    Period  Weeks    Status  On-going       SLP Long Term Goals - 11/17/19 1404      SLP LONG TERM GOAL #1   Title  pt will use speech intelligiblity strategies to improve intelligilibty to 100% in 10 minutes mod complex-complex conversation or work-like tasks (e.g., presentations) in 3 sessions    Time  8    Period  Weeks   or 17 total sessions, for all LTGs   Status  On-going      SLP LONG TERM GOAL #2   Title  pt will complete HEP for oral  strengthening and for speech intelligbility strategies with independence over 3 sessions    Time  8    Period  Weeks    Status  On-going       Plan - 11/17/19 1402    Clinical Impression Statement  Pt presents today with mild dysarthria after a rt basal ganglia CVA 10-23-19. SLP required to engage in careful listening much less today, 5% of the time, in conversation due to rate. Speech intelligibility was, actually, 98%+ today. 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 Pt appears highly motivated for improving his speech intelligibility.    Speech Therapy Frequency  2x / week    Duration  --   8 weeks, or 17 total sessions   Treatment/Interventions  Oral motor exercises;Functional tasks;Compensatory techniques;SLP instruction and feedback;Patient/family education;Internal/external aids    Potential to Achieve Goals  Good    SLP Home Exercise Plan  provided today    Consulted and Agree with Plan of Care  Patient       Patient will benefit from skilled therapeutic intervention in order to improve the following deficits and impairments:   Dysarthria and anarthria    Problem List Patient Active Problem List   Diagnosis Date Noted  . CVA (cerebral vascular accident) ([place]) 10/24/2019  . Acute CVA (cerebrovascular accident) ([place]) 10/23/2019  . Lower back pain 08/01/2019  . Dermatitis due to sun 02/07/2018  . Angular cheilosis 01/21/2018  . GASTROESOPHAGEAL REFLUX DISEASE 10/25/2007  . COLONIC POLYPS, ADENOMATOUS 08/20/2007  . ESOPHAGEAL STRICTURE 08/20/2007  . HIATAL HERNIA 08/20/2007    [facility] ,MS, CCC-SLP  11/17/2019, 2:04 PM  [facility] [address] [place], [place], [address] Phone: [phone]   Fax:  [phone]   Name: [name] MRN: [id number] Date of Birth: [birth year]

## 2019-11-17 NOTE — Patient Instructions (Signed)
Wear a rubber band around your wrist, a ring that you haven't worn in a long time, or something else to trigger the need to keep your speech rate at that 0.8 speed.

## 2019-11-18 ENCOUNTER — Telehealth: Payer: Self-pay

## 2019-11-18 NOTE — Telephone Encounter (Signed)
History of stroke, heartburn

## 2019-11-18 NOTE — Telephone Encounter (Signed)
New message   COVID vaccine appt 3.14.21  Please advise on health history  S/p CVA 2.11.21

## 2019-11-19 ENCOUNTER — Ambulatory Visit: Payer: Managed Care, Other (non HMO)

## 2019-11-19 ENCOUNTER — Other Ambulatory Visit: Payer: Self-pay

## 2019-11-19 DIAGNOSIS — R471 Dysarthria and anarthria: Secondary | ICD-10-CM | POA: Diagnosis not present

## 2019-11-19 NOTE — Telephone Encounter (Signed)
° ° °  Please return call to patient °

## 2019-11-19 NOTE — Therapy (Signed)
[facility] [address] [place], [place], [address] Phone: [phone]   Fax:  [phone]  Speech Language Pathology Treatment  Patient Details  Name: [name] MRN: [id number] Date of Birth: [birth year] Referring Provider (SLP): Eulogio Bear DO (however doc going to PCP Billey Gosling, MD)   Encounter Date: 11/19/2019  End of Session - 11/19/19 1540    Visit Number  3    Number of Visits  17    Date for SLP Re-Evaluation  01/02/20    SLP Start Time  32    SLP Stop Time   [id number]    SLP Time Calculation (min)  35 min    Activity Tolerance  Patient tolerated treatment well       Past Medical History:  Diagnosis Date  . Acute CVA (cerebrovascular accident) ([place])   . Allergy   . Colonic polyp    adenomatous  . Esophageal stricture   . GERD (gastroesophageal reflux disease)   . Hemorrhage, anal or rectal   . Hiatal hernia   . Reflux esophagitis     Past Surgical History:  Procedure Laterality Date  . COLONOSCOPY    . polyectomy  2008 & 2014  . POLYPECTOMY    . UPPER GASTROINTESTINAL ENDOSCOPY      There were no vitals filed for this visit.  Subjective Assessment - 11/19/19 1325    Subjective  "I practiced recording myself giving something that sounds like a training session; I'm not perfect but I think I could get by now."    Currently in Pain?  No/denies            ADULT SLP TREATMENT - 11/19/19 1326      General Information   Behavior/Cognition  Alert;Cooperative;Pleasant mood      Treatment Provided   Treatment provided  Cognitive-Linquistic      Cognitive-Linquistic Treatment   Treatment focused on  Dysarthria    Skilled Treatment  Pt was on team meeting since last session talking to co-workers - nobody had difficulty understanding him. In first 10 imnutes of session SLP had to carefully listen to pt x4 to understand what it was he was saying. At 10 minute mark, SLP shared with pt  that SLP has had to listen with effort and pt hsould decr rate to 0.8 speed. SLP and pt then went outdoors for 15 minutes and pt 100% intelligible without SLP needing to incorporate careful listening for the remainder of the session. SLP asked pt if he was comfortable decring to once/week adn pt agreed with this.       Assessment / Recommendations / Plan   Plan  Continue with current plan of care   decr to once/week due to progress     Progression Toward Goals   Progression toward goals  Progressing toward goals         SLP Short Term Goals - 11/19/19 1537      SLP SHORT TERM GOAL #1   Title  pt iwll perform HEP for overarticulation with rare min A in two sessions    Time  2    Period  Weeks    Status  On-going      SLP SHORT TERM GOAL #2   Title  pt will perform HEP for lingual and labial strengtheing with rare min A in 2 sessions    Time  2    Period  Weeks    Status  On-going  SLP SHORT TERM GOAL #3   Title  pt will use speech intelligiblity strategies to improve intelligilibty to ease listener burden in 10 minutes mod complex conversation in 3 sessions    Baseline  11-19-19    Time  4    Period  Weeks    Status  On-going       SLP Long Term Goals - 11/19/19 1539      SLP LONG TERM GOAL #1   Title  pt will use speech intelligiblity strategies to improve intelligilibty to 100% in 15 minutes mod complex-complex conversation or work-like tasks (e.g., presentations) in 3 sessions    Time  8    Period  Weeks   or 17 total sessions, for all LTGs   Status  Revised      SLP LONG TERM GOAL #2   Title  pt will complete HEP for oral strengthening and for speech intelligbility strategies with independence over 3 sessions    Time  8    Period  Weeks    Status  On-going       Plan - 11/19/19 1535    Clinical Impression Statement  Pt presents today with mild dysarthria after a rt basal ganglia CVA 10-23-19. SLP required to engage in careful listening for first 10 minutes,  until SLP mentioned this to pt - afterwards, pt 100% intelligible with good control of speech rate requiring no focused listening by SLP. 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 Pt agrees decr to x1/week is apporpriate at this time.    Speech Therapy Frequency  1x /week    Duration  --   8 weeks, or 17 total sessions   Treatment/Interventions  Oral motor exercises;Functional tasks;Compensatory techniques;SLP instruction and feedback;Patient/family education;Internal/external aids    Potential to Achieve Goals  Good    SLP Home Exercise Plan  provided today    Consulted and Agree with Plan of Care  Patient       Patient will benefit from skilled therapeutic intervention in order to improve the following deficits and impairments:   Dysarthria and anarthria    Problem List Patient Active Problem List   Diagnosis Date Noted  . CVA (cerebral vascular accident) ([place]) 10/24/2019  . Acute CVA (cerebrovascular accident) ([place]) 10/23/2019  . Lower back pain 08/01/2019  . Dermatitis due to sun 02/07/2018  . Angular cheilosis 01/21/2018  . GASTROESOPHAGEAL REFLUX DISEASE 10/25/2007  . COLONIC POLYPS, ADENOMATOUS 08/20/2007  . ESOPHAGEAL STRICTURE 08/20/2007  . HIATAL HERNIA 08/20/2007    [facility] ,MS, CCC-SLP  11/19/2019, 3:41 PM  [facility] [address] [place], [place], [address] Phone: [phone]   Fax:  [phone]   Name: [name] MRN: [id number] Date of Birth: 12/05/1960

## 2019-11-19 NOTE — Telephone Encounter (Signed)
yes

## 2019-11-19 NOTE — Telephone Encounter (Signed)
Due to hx and medication is it ok to get vaccine?

## 2019-11-20 NOTE — Telephone Encounter (Signed)
Pt aware.

## 2019-11-24 ENCOUNTER — Other Ambulatory Visit: Payer: Self-pay

## 2019-11-24 ENCOUNTER — Ambulatory Visit: Payer: Managed Care, Other (non HMO)

## 2019-11-24 ENCOUNTER — Telehealth: Payer: Self-pay | Admitting: Internal Medicine

## 2019-11-24 DIAGNOSIS — R471 Dysarthria and anarthria: Secondary | ICD-10-CM | POA: Diagnosis not present

## 2019-11-24 NOTE — Therapy (Signed)
Jeffrey [name] [address] [place], [place], [address] Phone: [phone]   Fax:  [phone]  Speech Language Pathology Treatment  Patient Details  Name: Jeffrey [name] MRN: [id number] Date of Birth: [birth year] Referring Provider (SLP): Eulogio Bear DO (however doc going to PCP Billey Gosling, MD)   Encounter Date: 11/24/2019  End of Session - 11/24/19 1354    Visit Number  4    Number of Visits  17    Date for SLP Re-Evaluation  01/02/20    SLP Start Time  1320    SLP Stop Time   1349    SLP Time Calculation (min)  29 min    Activity Tolerance  Patient tolerated treatment well       Past Medical History:  Diagnosis Date  . Acute CVA (cerebrovascular accident) ([place])   . Allergy   . Colonic polyp    adenomatous  . Esophageal stricture   . GERD (gastroesophageal reflux disease)   . Hemorrhage, anal or rectal   . Hiatal hernia   . Reflux esophagitis     Past Surgical History:  Procedure Laterality Date  . COLONOSCOPY    . polyectomy  2008 & 2014  . POLYPECTOMY    . UPPER GASTROINTESTINAL ENDOSCOPY      There were no vitals filed for this visit.  Subjective Assessment - 11/24/19 1327    Subjective  "My friend backed me up once during out conversation."    Currently in Pain?  No/denies            ADULT SLP TREATMENT - 11/24/19 1328      General Information   Behavior/Cognition  Alert;Cooperative;Pleasant mood      Treatment Provided   Treatment provided  Cognitive-Linquistic      Cognitive-Linquistic Treatment   Treatment focused on  Dysarthria    Skilled Treatment  Pt's friends that he spoke with for 2 hours - his wife asked him to repeat "once or twice" since last session. Pt completed HEP for articulatory precision with 100% accuracy repeating each phrase x2 (x18 phrases). HEP completed with SBA (one cue to slow down lingual ROM  movement). Pt ws 100% intelligible in 25 minutes  conversation.       Assessment / Recommendations / Plan   Plan  Continue with current plan of care   possible d/c next visit     Progression Toward Goals   Progression toward goals  Progressing toward goals         SLP Short Term Goals - 11/24/19 1355      SLP SHORT TERM GOAL #1   Title  pt iwll perform HEP for overarticulation with rare min A in two sessions    Baseline  11-24-19    Time  1    Period  Weeks    Status  On-going      SLP SHORT TERM GOAL #2   Title  pt will perform HEP for lingual and labial strengtheing with rare min A in 2 sessions    Baseline  11-24-19    Time  1    Period  Weeks    Status  On-going      SLP SHORT TERM GOAL #3   Title  pt will use speech intelligiblity strategies to improve intelligilibty to ease listener burden in 10 minutes mod complex conversation in 3 sessions    Status  Achieved       SLP Long Term Goals - 11/24/19 1355  SLP LONG TERM GOAL #1   Title  pt will use speech intelligiblity strategies to improve intelligilibty to 100% in 15 minutes mod complex-complex conversation or work-like tasks (e.g., presentations) in 3 sessions    Baseline  11-24-19    Time  7    Period  Weeks   or 17 total sessions, for all LTGs   Status  Revised      SLP LONG TERM GOAL #2   Title  pt will complete HEP for oral strengthening and for speech intelligbility strategies with independence over 3 sessions    Time  7    Period  Weeks    Status  On-going       Plan - 11/24/19 1350    Clinical Impression Statement  Pt presents today with functional/nomal articultion after a rt basal ganglia CVA 10-23-19. Pt 100% intelligible with good control of speech rate requiring no focused listening by SLP in 25 minutes conversation. HEPs completed with SBA for oral motor strength and independent for articulatory precision ("tongue twisters"). 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 Pt agrees decr to x1/week is apporpriate at this time. Possible d/c next visit.    Speech Therapy Frequency  1x /week    Duration  --   8 weeks, or 17 total sessions   Treatment/Interventions  Oral motor exercises;Functional tasks;Compensatory techniques;SLP instruction and feedback;Patient/family education;Internal/external aids    Potential to Achieve Goals  Good    SLP Home Exercise Plan  provided today    Consulted and Agree with Plan of Care  Patient       Patient will benefit from skilled therapeutic intervention in order to improve the following deficits and impairments:   Dysarthria and anarthria    Problem List Patient Active Problem List   Diagnosis Date Noted  . CVA (cerebral vascular accident) ([place]) 10/24/2019  . Acute CVA (cerebrovascular accident) ([place]) 10/23/2019  . Lower back pain 08/01/2019  . Dermatitis due to sun 02/07/2018  . Angular cheilosis 01/21/2018  . GASTROESOPHAGEAL REFLUX DISEASE 10/25/2007  . COLONIC POLYPS, ADENOMATOUS 08/20/2007  . ESOPHAGEAL STRICTURE 08/20/2007  . HIATAL HERNIA 08/20/2007    [facility] ,MS, CCC-SLP  11/24/2019, 1:56 PM  [facility] [address] [place], [place], [address] Phone: [phone]   Fax:  [phone]   Name: Jeffrey [name] MRN: [id number] Date of Birth: Sep 27, 1960

## 2019-11-24 NOTE — Telephone Encounter (Signed)
New Message:   Pt is inquiring about atorvastatin (LIPITOR) 40 MG tablet and if he is to still keep taking this medication. He is aware Dr. Quay Burow did not prescribe this medication but it will be 11/04/19 before he can see a Neuro Dr. Please advise.

## 2019-11-25 NOTE — Telephone Encounter (Signed)
Yes, needs to continue

## 2019-11-26 NOTE — Telephone Encounter (Signed)
Pt aware.

## 2019-11-27 ENCOUNTER — Telehealth: Payer: Self-pay

## 2019-11-27 MED ORDER — ATORVASTATIN CALCIUM 40 MG PO TABS: 40.0000 mg | ORAL_TABLET | Freq: Every day | ORAL | 1 refills | Status: DC

## 2019-11-27 NOTE — Telephone Encounter (Signed)
Going forward 90 days supply   Has a coupon from [facility] for 30 days which he did last night   1.Medication Requested:atorvastatin (LIPITOR) 40 MG tablet  2. Pharmacy (Name, Street, City):[id number] - [place] [place] [address]  3. On Med List: Yes   4. Last Visit with PCP: 2.22.21   5. Next visit date with PCP: 9.7.21    Agent: Please be advised that RX refills may take up to 3 business days. We ask that you follow-up with your pharmacy.

## 2019-11-27 NOTE — Telephone Encounter (Signed)
Ok to refill? You have not prescribed.

## 2019-11-27 NOTE — Telephone Encounter (Signed)
Rx sent 

## 2019-11-27 NOTE — Telephone Encounter (Signed)
yes

## 2019-12-01 ENCOUNTER — Other Ambulatory Visit: Payer: Self-pay

## 2019-12-01 ENCOUNTER — Ambulatory Visit: Payer: Managed Care, Other (non HMO)

## 2019-12-01 DIAGNOSIS — R471 Dysarthria and anarthria: Secondary | ICD-10-CM

## 2019-12-01 NOTE — Patient Instructions (Signed)
Do your exercises for about 3 more weeks and then file them for later use if necessary.

## 2019-12-01 NOTE — Therapy (Signed)
[place] [address] [place], [place], [address] Phone: [phone]   Fax:  [phone]  Speech Language Pathology Treatment/ Discharge Summary  Patient Details  Name: Jeffrey [name] MRN: [id number] Date of Birth: 11-Apr-1961 Referring Provider (SLP): Eulogio Bear DO (however doc going to PCP Billey Gosling, MD)   Encounter Date: 12/01/2019  End of Session - 12/01/19 1406    Visit Number  5    Number of Visits  17    Date for SLP Re-Evaluation  01/02/20    SLP Start Time  19    SLP Stop Time   1346    SLP Time Calculation (min)  28 min    Activity Tolerance  Patient tolerated treatment well       Past Medical History:  Diagnosis Date  . Acute CVA (cerebrovascular accident) ([place])   . Allergy   . Colonic polyp    adenomatous  . Esophageal stricture   . GERD (gastroesophageal reflux disease)   . Hemorrhage, anal or rectal   . Hiatal hernia   . Reflux esophagitis     Past Surgical History:  Procedure Laterality Date  . COLONOSCOPY    . polyectomy  2008 & 2014  . POLYPECTOMY    . UPPER GASTROINTESTINAL ENDOSCOPY      There were no vitals filed for this visit.  Subjective Assessment - 12/01/19 1403    Subjective  "No problems since last time with people understanding me."    Currently in Pain?  No/denies            ADULT SLP TREATMENT - 12/01/19 1404      General Information   Behavior/Cognition  Alert;Cooperative;Pleasant mood      Treatment Provided   Treatment provided  Cognitive-Linquistic      Cognitive-Linquistic Treatment   Treatment focused on  Dysarthria    Skilled Treatment  "I feel good, I'm about 90% (of baseline)." SLP engaged pt in mod complex/complex conversation for 20 minutes and pt with 100% intelligibility without need for SLP to exhibit focused listening. Pt came back into ST room and performed HEPs spontaneously. Pt agreed with d/c today.       Assessment /  Recommendations / Plan   Plan  Discharge SLP treatment due to (comment)      Progression Toward Goals   Progression toward goals  --   d/c - see goals        SLP Short Term Goals - 12/01/19 1344      SLP SHORT TERM GOAL #1   Title  pt iwll perform HEP for overarticulation with rare min A in two sessions    Baseline  11-24-19    Status  Achieved      SLP SHORT TERM GOAL #2   Title  pt will perform HEP for lingual and labial strengtheing with rare min A in 2 sessions    Baseline  11-24-19    Status  Achieved      SLP SHORT TERM GOAL #3   Title  pt will use speech intelligiblity strategies to improve intelligilibty to ease listener burden in 10 minutes mod complex conversation in 3 sessions    Status  Achieved       SLP Long Term Goals - 12/01/19 1343      SLP LONG TERM GOAL #1   Title  pt will use speech intelligiblity strategies to improve intelligilibty to 100% in 15 minutes mod complex-complex conversation or work-like tasks (e.g., presentations)  in 3 sessions    Baseline  11-24-19, 12-01-19    Period  --   or 17 total sessions, for all LTGs   Status  Partially Met      SLP LONG TERM GOAL #2   Title  pt will complete HEP for oral strengthening and for speech intelligbility strategies with independence over 3 sessions    Status  Partially Met       Plan - 12/01/19 1407    Clinical Impression Statement  Pt presents today with functional/nomal articultion after a rt basal ganglia CVA 10-23-19. Pt 100% intelligible with good control of speech rate againrequired no focused listening by SLP in 20 minutes conversation. HEPs completed independently. Pt agrees to d/c today.    Treatment/Interventions  Oral motor exercises;Functional tasks;Compensatory techniques;SLP instruction and feedback;Patient/family education;Internal/external aids    Potential to Achieve Goals  Good    SLP Home Exercise Plan  provided today    Consulted and Agree with Plan of Care  Patient       Patient  will benefit from skilled therapeutic intervention in order to improve the following deficits and impairments:   Dysarthria and anarthria   SPEECH THERAPY DISCHARGE SUMMARY  Visits from Start of Care: 5  Current functional level related to goals / functional outcomes: Pt made excellent progress in only 4 sessions. His HEP was completed independently and over two sessions spoke with 100% intelligibility with no focused lisetning necessary by SLP.   Remaining deficits: None noted during therapy however pt notes rare mild slurring of speech, usually with fatigue.   Education / Equipment: Compensations for dysarthria, HEPs   Plan: Patient agrees to discharge.  Patient goals were partially met. Patient is being discharged due to being pleased with the current functional level.  ?????       Problem List Patient Active Problem List   Diagnosis Date Noted  . CVA (cerebral vascular accident) ([place]) 10/24/2019  . Acute CVA (cerebrovascular accident) ([place]) 10/23/2019  . Lower back pain 08/01/2019  . Dermatitis due to sun 02/07/2018  . Angular cheilosis 01/21/2018  . GASTROESOPHAGEAL REFLUX DISEASE 10/25/2007  . COLONIC POLYPS, ADENOMATOUS 08/20/2007  . ESOPHAGEAL STRICTURE 08/20/2007  . HIATAL HERNIA 08/20/2007    [facility] ,MS, CCC-SLP  12/01/2019, 2:09 PM  [place] [address] [place], [place], [address] Phone: [phone]   Fax:  [phone]   Name: Jeffrey [name] MRN: [id number] Date of Birth: 22-Mar-1961

## 2019-12-02 ENCOUNTER — Encounter: Payer: Self-pay | Admitting: Adult Health

## 2019-12-02 ENCOUNTER — Ambulatory Visit (INDEPENDENT_AMBULATORY_CARE_PROVIDER_SITE_OTHER): Payer: Managed Care, Other (non HMO) | Admitting: Adult Health

## 2019-12-02 VITALS — BP 122/88 | HR 77 | Temp 97.4°F | Ht 68.0 in | Wt 165.8 lb

## 2019-12-02 DIAGNOSIS — I639 Cerebral infarction, unspecified: Secondary | ICD-10-CM

## 2019-12-02 DIAGNOSIS — E785 Hyperlipidemia, unspecified: Secondary | ICD-10-CM

## 2019-12-02 DIAGNOSIS — I69322 Dysarthria following cerebral infarction: Secondary | ICD-10-CM

## 2019-12-02 DIAGNOSIS — Z720 Tobacco use: Secondary | ICD-10-CM | POA: Diagnosis not present

## 2019-12-02 NOTE — Progress Notes (Signed)
[facility] [address] [place]. [place] [address] [phone]       HOSPITAL FOLLOW UP NOTE  Mr. Paull Schrimsher Date of Birth:  03-May-1961 Medical Record Number:  [id number]   Reason for Referral:  hospital stroke follow up    CHIEF COMPLAINT:  Chief Complaint  Patient presents with  . Cerebrovascular Accident    Rm9, alone. Hosp f/u.  Mild residual deficits -improving    HPI: Mowgli Whetham being seen today for in office hospital follow-up regarding right BG/PLIC stroke secondary to small vessel disease.  History obtained from patient and chart review. Reviewed all radiology images and labs personally.  Mr. Trevelle Rosenfield is a 59 y.o. male with no significant past medical history presented on 10/23/2019 with L facial droop and dysarthria.  Evaluated by stroke team and Dr. Erlinda Hong with stroke work-up revealing right BG/PLIC small infarct secondary to small vessel disease source.  Recommended DAPT for 3 weeks and aspirin alone.  UDS positive for cocaine with patient admitting to intermittent cocaine use with cocaine cessation education provided.  LDL 99 initiate atorvastatin 40 mg daily.  No history of HTN or DM.  Other stroke risk factors include smokeless tobacco use and EtOH use but no prior history of stroke.  Residual deficits of left facial droop, mild dysarthria and left hand dysmetria and discharged home in stable condition with recommendation of outpatient therapy.  Mr. Kenner is a 59 year old male who is being seen today for hospital follow-up.  He has been recovering well from a stroke standpoint with residual mild left facial droop, mild dysarthria and decreased left hand dexterity but does report overall improvement.  He recently completed speech therapy but continues to do exercises at home.  He does report intermittent RUE "cold sensation" in forearm which he was experiencing initially upon presentation to ED.  He denies pain, weakness or  numbness/tingling.  He has returned back to all prior activities without difficulty including working.  Completed 3 weeks DAPT and continues on aspirin alone without bleeding or bruising.  Continues on atorvastatin 40 mg daily without myalgias.  He does question if atorvastatin could be causing RUE symptoms and if he needs a change to a different statin.  Blood pressure today 122/88.  He reports ongoing smokeless tobacco use but has greatly decreased use with hopeful near future complete cessation.  Endorses complete cessation of cocaine and denies any other recreational drug use.  No concerns at this time.       ROS:   14 system review of systems performed and negative with exception of dysarthria, decreased right hand dexterity, and facial weakness  PMH:  Past Medical History:  Diagnosis Date  . Acute CVA (cerebrovascular accident) ([place])   . Allergy   . Colonic polyp    adenomatous  . Esophageal stricture   . GERD (gastroesophageal reflux disease)   . Hemorrhage, anal or rectal   . Hiatal hernia   . Reflux esophagitis     PSH:  Past Surgical History:  Procedure Laterality Date  . COLONOSCOPY    . polyectomy  2008 & 2014  . POLYPECTOMY    . UPPER GASTROINTESTINAL ENDOSCOPY      Social History:  Social History   Socioeconomic History  . Marital status: Married    Spouse name: Not on file  . Number of children: Not on file  . Years of education: Not on file  . Highest education level: Not on file  Occupational History  . Not on  file  Tobacco Use  . Smoking status: Never Smoker  . Smokeless tobacco: Current User    Types: Snuff  Substance and Sexual Activity  . Alcohol use: Yes    Comment: occasionally  . Drug use: No  . Sexual activity: Not on file  Other Topics Concern  . Not on file  Social History Narrative   [employer] softball a lot      No regular exercise   Social Determinants of Health   Financial Resource Strain:   . Difficulty of Paying Living  Expenses:   Food Insecurity:   . Worried About Charity fundraiser in the Last Year:   . Arboriculturist in the Last Year:   Transportation Needs:   . Film/video editor (Medical):   Marland Kitchen Lack of Transportation (Non-Medical):   Physical Activity:   . Days of Exercise per Week:   . Minutes of Exercise per Session:   Stress:   . Feeling of Stress :   Social Connections:   . Frequency of Communication with Friends and Family:   . Frequency of Social Gatherings with Friends and Family:   . Attends Religious Services:   . Active Member of Clubs or Organizations:   . Attends Archivist Meetings:   Marland Kitchen Marital Status:   Intimate Partner Violence:   . Fear of Current or Ex-Partner:   . Emotionally Abused:   Marland Kitchen Physically Abused:   . Sexually Abused:     Family History:  Family History  Problem Relation Age of Onset  . Colon cancer Neg Hx   . Stomach cancer Neg Hx   . Esophageal cancer Neg Hx   . Rectal cancer Neg Hx   . Liver cancer Neg Hx   . Pancreatic cancer Neg Hx     Medications:   Current Outpatient Medications on File Prior to Visit  Medication Sig Dispense Refill  . aspirin EC 81 MG EC tablet Take 1 tablet (81 mg total) by mouth daily.    Marland Kitchen atorvastatin (LIPITOR) 40 MG tablet Take 1 tablet (40 mg total) by mouth daily at 6 PM. 90 tablet 1   No current facility-administered medications on file prior to visit.    Allergies:  No Known Allergies   Physical Exam  Vitals:   12/02/19 1122  BP: 122/88  Pulse: 77  Temp: (!) 97.4 F (36.3 C)  Weight: 165 lb 12.8 oz (75.2 kg)  Height: 5\' 8"  (1.727 m)   Body mass index is 25.21 kg/m. No exam data present  Depression screen [facility] 2/9 12/02/2019  Decreased Interest 0  Down, Depressed, Hopeless 0  PHQ - 2 Score 0     General: well developed, well nourished,  pleasant middle-age Caucasian male, seated, in no evident distress Head: head normocephalic and atraumatic.   Neck: supple with no carotid or  supraclavicular bruits Cardiovascular: regular rate and rhythm, no murmurs Musculoskeletal: no deformity Skin:  no rash/petichiae Vascular:  Normal pulses all extremities   Neurologic Exam Mental Status: Awake and fully alert.   Mild dysarthria.  Oriented to place and time. Recent and remote memory intact. Attention span, concentration and fund of knowledge appropriate. Mood and affect appropriate.  Cranial Nerves: Fundoscopic exam reveals sharp disc margins. Pupils equal, briskly reactive to light. Extraocular movements full without nystagmus. Visual fields full to confrontation. Hearing intact. Facial sensation intact.  Mild left lower facial weakness.  Questionable slight right lower facial weakness Motor: Normal bulk and tone. Normal strength  in all tested extremity muscles except slightly decreased left hand finger dexterity. Sensory.: intact to touch , pinprick , position and vibratory sensation.  Coordination: Rapid alternating movements normal in all extremities except mildly decreased left hand dexterity. Finger-to-nose and heel-to-shin performed accurately bilaterally. Gait and Station: Arises from chair without difficulty. Stance is normal. Gait demonstrates normal stride length and balance Reflexes: 1+ and symmetric. Toes downgoing.     NIHSS  2 Modified Rankin  1    Diagnostic Data (Labs, Imaging, Testing)   Code Stroke CT head No acute abnormality. sinus dz. ASPECTS 10.     CTA head & neck normal  MRI  R basal ganglia / PLIC infarct   2D Echo EF 60-65%. No source of embolus   LDL 99  HgbA1c 5.0    ASSESSMENT: Jeffrey [name] is a 59 y.o. year old male presented with left facial droop and dysarthria on 10/23/2019 with stroke work-up revealing right BG/PLIC stroke secondary to small vessel disease source. Vascular risk factors include HLD, smokeless tobacco use and cocaine use.  Residual deficits on mild dysarthria, left lower facial weakness and mild decrease left  hand dexterity.  Questionable right lower facial weakness and subjective RUE positive sensory disturbance.     PLAN:  1. R BG/PLIC stroke with deficits: Continue aspirin 81 mg daily  and atorvastatin for secondary stroke prevention. Maintain strict control of hypertension with blood pressure goal below 130/90, diabetes with hemoglobin A1c goal below 6.5% and cholesterol with LDL cholesterol (bad cholesterol) goal below 70 mg/dL.  I also advised the patient to eat a healthy diet with plenty of whole grains, cereals, fruits and vegetables, exercise regularly with at least 30 minutes of continuous activity daily and maintain ideal body weight.  Advised to continue to do HEP as recommended after therapy sessions for hopeful ongoing improvement 2. Right-sided symptoms: Patient reports present with initial stroke symptoms.  He denies any worsening or changing symptoms therefore recommend continuing to monitor at this time as further evaluation would likely not change current treatment plan at this time.  Advised patient likely not side effect from atorvastatin and to continue at this time. 3. HLD: Advised to continue current treatment regimen along with continued follow-up with PCP for future prescribing and monitoring of lipid panel 4. Substance abuse: Congratulated on complete cessation highly encouraged ongoing cessation 5. Smokeless tobacco use: Discussion regarding importance of complete cessation with patient verbalizing understanding.  Goal is for complete cessation and currently decreasing daily amount    Follow up in 4 months or call earlier if needed   Greater than 50% of time during this 45 minute visit was spent on counseling, explanation of diagnosis of R BG/PLIC stroke, reviewing risk factor management of HLD, substance abuse and smokeless tobacco use, planning of further management along with potential future management, and discussion with patient and family answering all  questions.    Frann Rider, AGNP-BC  [facility] [address] [place], [place] [address]  Phone [phone] Fax [phone] Note: This document was prepared with digital dictation and possible smart phrase technology. Any transcriptional errors that result from this process are unintentional.

## 2019-12-02 NOTE — Patient Instructions (Signed)
Continue aspirin 81 mg daily  and lipitor  for secondary stroke prevention  Continue to follow up with PCP regarding cholesterol management   Continue to monitor blood pressure at home  Maintain strict control of hypertension with blood pressure goal below 130/90, diabetes with hemoglobin A1c goal below 6.5% and cholesterol with LDL cholesterol (bad cholesterol) goal below 70 mg/dL. I also advised the patient to eat a healthy diet with plenty of whole grains, cereals, fruits and vegetables, exercise regularly and maintain ideal body weight.  Followup in the future with me in 4 months or call earlier if needed       Thank you for coming to see [place] at [facility]. I hope we have been able to provide you high quality care today.  You may receive a patient satisfaction survey over the next few weeks. We would appreciate your feedback and comments so that we may continue to improve ourselves and the health of our patients.

## 2019-12-03 ENCOUNTER — Encounter: Payer: Self-pay | Admitting: Adult Health

## 2019-12-12 NOTE — Progress Notes (Signed)
I agree with the above plan 

## 2020-04-07 ENCOUNTER — Encounter: Payer: Self-pay | Admitting: Adult Health

## 2020-04-07 ENCOUNTER — Ambulatory Visit (INDEPENDENT_AMBULATORY_CARE_PROVIDER_SITE_OTHER): Payer: Managed Care, Other (non HMO) | Admitting: Adult Health

## 2020-04-07 ENCOUNTER — Other Ambulatory Visit: Payer: Self-pay

## 2020-04-07 VITALS — BP 119/82 | HR 86 | Ht 68.0 in | Wt 156.0 lb

## 2020-04-07 DIAGNOSIS — E785 Hyperlipidemia, unspecified: Secondary | ICD-10-CM

## 2020-04-07 DIAGNOSIS — I639 Cerebral infarction, unspecified: Secondary | ICD-10-CM | POA: Diagnosis not present

## 2020-04-07 DIAGNOSIS — F482 Pseudobulbar affect: Secondary | ICD-10-CM | POA: Diagnosis not present

## 2020-04-07 NOTE — Progress Notes (Signed)
[facility] [address] [place]. [place] [address] [phone]       STROKE FOLLOW UP NOTE  [name] Date of Birth:  01-18-1961 Medical Record Number:  [id number]   Reason for Referral: stroke follow up    CHIEF COMPLAINT:  Chief Complaint  Patient presents with  . Follow-up    rm 5 here for a stroke f/u. Pt is having no new sx    HPI:  Today, 04/07/2020, [name] returns for stroke follow-up.  He has been doing well since prior visit with great improvement of prior stroke deficits.  Reports occasional dysarthria typically when speaking too quickly and very slight decrease left hand dexterity but does not interfere with daily activities.  He also reports since his stroke, he has been experiencing heightened emotions such as crying seeing a sad movie or listening to a song.  Did not experience this prior.  Denies symptoms of depression or anxiety.  Denies new stroke/TIA symptoms.  Continues on aspirin and atorvastatin without side effects.  Blood pressure today 119/82.  Has follow-up next month with PCP.  No further concerns.    History provided for reference purposes only Initial visit 12/02/2019 JM: [name] is a 59 year old male who is being seen today for hospital follow-up.  He has been recovering well from a stroke standpoint with residual mild left facial droop, mild dysarthria and decreased left hand dexterity but does report overall improvement.  He recently completed speech therapy but continues to do exercises at home.  He does report intermittent RUE "cold sensation" in forearm which he was experiencing initially upon presentation to ED.  He denies pain, weakness or numbness/tingling.  He has returned back to all prior activities without difficulty including working.  Completed 3 weeks DAPT and continues on aspirin alone without bleeding or bruising.  Continues on atorvastatin 40 mg daily without myalgias.  He does question if  atorvastatin could be causing RUE symptoms and if he needs a change to a different statin.  Blood pressure today 122/88.  He reports ongoing smokeless tobacco use but has greatly decreased use with hopeful near future complete cessation.  Endorses complete cessation of cocaine and denies any other recreational drug use.  No concerns at this time.  Stroke admission 10/23/2019: [name] is a 59 y.o. male with no significant past medical history presented on 10/23/2019 with L facial droop and dysarthria.  Evaluated by stroke team and Dr. Erlinda Hong with stroke work-up revealing right BG/PLIC small infarct secondary to small vessel disease source.  Recommended DAPT for 3 weeks and aspirin alone.  UDS positive for cocaine with patient admitting to intermittent cocaine use with cocaine cessation education provided.  LDL 99 initiate atorvastatin 40 mg daily.  No history of HTN or DM.  Other stroke risk factors include smokeless tobacco use and EtOH use but no prior history of stroke.  Residual deficits of left facial droop, mild dysarthria and left hand dysmetria and discharged home in stable condition with recommendation of outpatient therapy.  Stroke:   R BG/PLIC small infarct secondary to small vessel disease source  Code Stroke CT head No acute abnormality. sinus dz. ASPECTS 10.     CTA head & neck normal  MRI  R basal ganglia / PLIC infarct   2D Echo EF 60-65%. No source of embolus   LDL 99  HgbA1c 5.0  Lovenox 40 mg sq daily for VTE prophylaxis  No antithrombotic prior to admission, now on aspirin 81  mg daily and clopidogrel 75 mg daily following load. Continue DAPT x 3 weeks then ASPIRIN alone    Therapy recommendations:  OP SLP, no PT  Disposition:  Return home       ROS:   14 system review of systems performed and negative with exception of dysarthria, decreased right hand dexterity, and facial weakness  PMH:  Past Medical History:  Diagnosis Date  . Acute CVA  (cerebrovascular accident) ([place])   . Allergy   . Colonic polyp    adenomatous  . Esophageal stricture   . GERD (gastroesophageal reflux disease)   . Hemorrhage, anal or rectal   . Hiatal hernia   . Reflux esophagitis     PSH:  Past Surgical History:  Procedure Laterality Date  . COLONOSCOPY    . polyectomy  2008 & 2014  . POLYPECTOMY    . UPPER GASTROINTESTINAL ENDOSCOPY      Social History:  Social History   Socioeconomic History  . Marital status: Married    Spouse name: Not on file  . Number of children: Not on file  . Years of education: Not on file  . Highest education level: Not on file  Occupational History  . Not on file  Tobacco Use  . Smoking status: Never Smoker  . Smokeless tobacco: Current User    Types: Snuff  Vaping Use  . Vaping Use: Never used  Substance and Sexual Activity  . Alcohol use: Yes    Comment: occasionally  . Drug use: No  . Sexual activity: Not on file  Other Topics Concern  . Not on file  Social History Narrative   Umpires softball a lot      No regular exercise   Social Determinants of Health   Financial Resource Strain:   . Difficulty of Paying Living Expenses:   Food Insecurity:   . Worried About Charity fundraiser in the Last Year:   . Arboriculturist in the Last Year:   Transportation Needs:   . Film/video editor (Medical):   Marland Kitchen Lack of Transportation (Non-Medical):   Physical Activity:   . Days of Exercise per Week:   . Minutes of Exercise per Session:   Stress:   . Feeling of Stress :   Social Connections:   . Frequency of Communication with Friends and Family:   . Frequency of Social Gatherings with Friends and Family:   . Attends Religious Services:   . Active Member of Clubs or Organizations:   . Attends Archivist Meetings:   Marland Kitchen Marital Status:   Intimate Partner Violence:   . Fear of Current or Ex-Partner:   . Emotionally Abused:   Marland Kitchen Physically Abused:   . Sexually Abused:     Family  History:  Family History  Problem Relation Age of Onset  . Colon cancer Neg Hx   . Stomach cancer Neg Hx   . Esophageal cancer Neg Hx   . Rectal cancer Neg Hx   . Liver cancer Neg Hx   . Pancreatic cancer Neg Hx     Medications:   Current Outpatient Medications on File Prior to Visit  Medication Sig Dispense Refill  . aspirin EC 81 MG EC tablet Take 1 tablet (81 mg total) by mouth daily.    Marland Kitchen atorvastatin (LIPITOR) 40 MG tablet Take 1 tablet (40 mg total) by mouth daily at 6 PM. 90 tablet 1   No current facility-administered medications on file prior to visit.  Allergies:  No Known Allergies   Physical Exam  Vitals:   04/07/20 0945  BP: 119/82  Pulse: 86  Weight: 156 lb (70.8 kg)  Height: 5\' 8"  (1.727 m)   Body mass index is 23.72 kg/m. No exam data present   General: well developed, well nourished,  pleasant middle-age Caucasian male, seated, in no evident distress Head: head normocephalic and atraumatic.   Neck: supple with no carotid or supraclavicular bruits Cardiovascular: regular rate and rhythm, no murmurs Musculoskeletal: no deformity Skin:  no rash/petichiae Vascular:  Normal pulses all extremities   Neurologic Exam Mental Status: Awake and fully alert.   Occasional slight dysarthria with speaking to quickly.  Oriented to place and time. Recent and remote memory intact. Attention span, concentration and fund of knowledge appropriate. Mood and affect appropriate.  Cranial Nerves: Pupils equal, briskly reactive to light. Extraocular movements full without nystagmus. Visual fields full to confrontation. Hearing intact. Facial sensation intact.  Mild left nasolabial fold flattening Motor: Normal bulk and tone. Normal strength in all tested extremity muscles  Sensory.: intact to touch , pinprick , position and vibratory sensation.  Coordination: Rapid alternating movements normal in all extremities except very slightly decreased left hand.  Finger-to-nose and  heel-to-shin performed accurately bilaterally. Gait and Station: Arises from chair without difficulty. Stance is normal. Gait demonstrates normal stride length and balance Reflexes: 1+ and symmetric. Toes downgoing.       ASSESSMENT: Jeffrey [name] is a 59 y.o. year old male presented with left facial droop and dysarthria on 10/23/2019 with stroke work-up revealing right BG/PLIC stroke secondary to small vessel disease source. Vascular risk factors include HLD, smokeless tobacco use and cocaine use.      PLAN:  1. R BG/PLIC stroke with deficits: Residual deficits: Occasional dysarthria, left nasolabial fold flattening and very slight decreased left hand dexterity.  Ongoing improvement.  Encouraged continued HEP continue aspirin 81 mg daily  and atorvastatin for secondary stroke prevention.  Close PCP follow-up for aggressive stroke risk factor management 2. Heightened Emotions: Possible mild form of PBA vs mild post stroke depression.  Does not interfere with daily activity or social life.  Advised to continue to monitor and follow-up with PCP with any worsening or interference with daily functioning 3. HLD: LDL goal<70.  Continue atorvastatin and close PCP follow-up for prescribing, monitoring and management   Overall stable from stroke standpoint recommend follow-up on an as-needed basis   I spent 30 minutes of face-to-face and non-face-to-face time with patient.  This included previsit chart review, lab review, study review, order entry, electronic health record documentation, patient education regarding history of stroke, possible PBA vs mild post stroke depression, importance of managing stroke risk factors and answered all questions to patient satisfaction   Frann Rider, [facility]  [facility] [address] [place], [place] [address]  Phone [phone] Fax [phone] Note: This document was prepared with digital dictation and possible smart  phrase technology. Any transcriptional errors that result from this process are unintentional.

## 2020-04-07 NOTE — Patient Instructions (Signed)
Continue aspirin 81 mg daily  and atorvastatin for secondary stroke prevention  Continue to follow up with PCP regarding cholesterol management  Maintain strict control of hypertension with blood pressure goal below 130/90, diabetes with hemoglobin A1c goal below 6.5% and cholesterol with LDL cholesterol (bad cholesterol) goal below 70 mg/dL.             Thank you for coming to see [place] at [facility]. I hope we have been able to provide you high quality care today.  You may receive a patient satisfaction survey over the next few weeks. We would appreciate your feedback and comments so that we may continue to improve ourselves and the health of our patients.

## 2020-04-08 NOTE — Progress Notes (Signed)
I agree with the above plan 

## 2020-05-16 NOTE — Progress Notes (Signed)
Subjective:    Patient ID: Jeffrey [name], male    DOB: 1961/07/22, 59 y.o.   MRN: [id number]  HPI He is here for a physical exam.   He denies changes in his history.   Medications and allergies reviewed with patient and updated if appropriate.  Patient Active Problem List   Diagnosis Date Noted  . History of CVA (cerebrovascular accident) 10/23/2019  . Lower back pain 08/01/2019  . Dermatitis due to sun 02/07/2018  . Angular cheilosis 01/21/2018  . GASTROESOPHAGEAL REFLUX DISEASE 10/25/2007  . COLONIC POLYPS, ADENOMATOUS 08/20/2007  . ESOPHAGEAL STRICTURE 08/20/2007  . HIATAL HERNIA 08/20/2007    Current Outpatient Medications on File Prior to Visit  Medication Sig Dispense Refill  . aspirin EC 81 MG EC tablet Take 1 tablet (81 mg total) by mouth daily.    Marland Kitchen atorvastatin (LIPITOR) 40 MG tablet Take 1 tablet (40 mg total) by mouth daily at 6 PM. 90 tablet 1   No current facility-administered medications on file prior to visit.    Past Medical History:  Diagnosis Date  . Acute CVA (cerebrovascular accident) ([place])   . Allergy   . Colonic polyp    adenomatous  . Esophageal stricture   . GERD (gastroesophageal reflux disease)   . Hemorrhage, anal or rectal   . Hiatal hernia   . Reflux esophagitis     Past Surgical History:  Procedure Laterality Date  . COLONOSCOPY    . polyectomy  2008 & 2014  . POLYPECTOMY    . UPPER GASTROINTESTINAL ENDOSCOPY      Social History   Socioeconomic History  . Marital status: Married    Spouse name: Not on file  . Number of children: Not on file  . Years of education: Not on file  . Highest education level: Not on file  Occupational History  . Not on file  Tobacco Use  . Smoking status: Never Smoker  . Smokeless tobacco: Current User    Types: Snuff  Vaping Use  . Vaping Use: Never used  Substance and Sexual Activity  . Alcohol use: Yes    Comment: occasionally  . Drug use: No  . Sexual activity: Not on file    Other Topics Concern  . Not on file  Social History Narrative   Umpires softball a lot      No regular exercise   Social Determinants of Health   Financial Resource Strain:   . Difficulty of Paying Living Expenses: Not on file  Food Insecurity:   . Worried About Charity fundraiser in the Last Year: Not on file  . Ran Out of Food in the Last Year: Not on file  Transportation Needs:   . Lack of Transportation (Medical): Not on file  . Lack of Transportation (Non-Medical): Not on file  Physical Activity:   . Days of Exercise per Week: Not on file  . Minutes of Exercise per Session: Not on file  Stress:   . Feeling of Stress : Not on file  Social Connections:   . Frequency of Communication with Friends and Family: Not on file  . Frequency of Social Gatherings with Friends and Family: Not on file  . Attends Religious Services: Not on file  . Active Member of Clubs or Organizations: Not on file  . Attends Archivist Meetings: Not on file  . Marital Status: Not on file    Family History  Problem Relation Age of Onset  . Colon cancer  Neg Hx   . Stomach cancer Neg Hx   . Esophageal cancer Neg Hx   . Rectal cancer Neg Hx   . Liver cancer Neg Hx   . Pancreatic cancer Neg Hx     Review of Systems  Constitutional: Negative for chills and fever.  Eyes: Negative for visual disturbance.  Respiratory: Negative for cough, shortness of breath and wheezing.   Cardiovascular: Negative for chest pain, palpitations and leg swelling.  Gastrointestinal: Negative for abdominal pain, blood in stool, constipation, diarrhea and nausea.  Genitourinary: Negative for difficulty urinating and dysuria.  Musculoskeletal: Positive for back pain (occ). Negative for arthralgias.  Skin: Negative for rash.  Neurological: Positive for speech difficulty (from CVA). Negative for dizziness, light-headedness and headaches.  Psychiatric/Behavioral: Negative for dysphoric mood. The patient is not  nervous/anxious.        Objective:   Vitals:   05/18/20 0941  BP: 120/78  Pulse: 70  Temp: 97.9 F (36.6 C)  SpO2: 95%   Filed Weights   05/18/20 0941  Weight: 163 lb 12.8 oz (74.3 kg)   Body mass index is 24.91 kg/m.  BP Readings from Last 3 Encounters:  05/18/20 120/78  04/07/20 119/82  12/02/19 122/88    Wt Readings from Last 3 Encounters:  05/18/20 163 lb 12.8 oz (74.3 kg)  04/07/20 156 lb (70.8 kg)  12/02/19 165 lb 12.8 oz (75.2 kg)     Physical Exam Constitutional: He appears well-developed and well-nourished. No distress.  HENT:  Head: Normocephalic and atraumatic.  Right Ear: External ear normal.  Left Ear: External ear normal.  Mouth/Throat: Oropharynx is clear and moist.  Normal ear canals and TM b/l  Eyes: Conjunctivae and EOM are normal.  Neck: Neck supple. No tracheal deviation present. No thyromegaly present.  No carotid bruit  Cardiovascular: Normal rate, regular rhythm, normal heart sounds and intact distal pulses.   No murmur heard. Pulmonary/Chest: Effort normal and breath sounds normal. No respiratory distress. He has no wheezes. He has no rales.  Abdominal: Soft. He exhibits no distension. There is no tenderness.  Genitourinary: deferred  Musculoskeletal: He exhibits no edema.  Lymphadenopathy:   He has no cervical adenopathy.  Neuro:  Mild dysarthria Skin: Skin is warm and dry. He is not diaphoretic.  Psychiatric: He has a normal mood and affect. His behavior is normal.         Assessment & Plan:   Physical exam: Screening blood work  ordered Immunizations  had Covid, advised flu vaccine Colonoscopy   Up to date  Eye exams   Up to date  Exercise   Active - no regimented exercise, umpires softball Weight  normal Substance abuse   some Sees derm annually   See Problem List for Assessment and Plan of chronic medical problems.   This visit occurred during the SARS-CoV-2 public health emergency.  Safety protocols were in  place, including screening questions prior to the visit, additional usage of staff PPE, and extensive cleaning of exam room while observing appropriate contact time as indicated for disinfecting solutions.

## 2020-05-16 NOTE — Patient Instructions (Addendum)
Blood work was ordered.    All other Health Maintenance issues reviewed.   All recommended immunizations and age-appropriate screenings are up-to-date or discussed.  No immunization administered today.   Medications reviewed and updated.  Changes include :   Restart omeprazole 20 mg daily  Your prescription(s) have been submitted to your pharmacy. Please take as directed and contact our office if you believe you are having problem(s) with the medication(s).   Please followup in 1 year    Health Maintenance, Male Adopting a healthy lifestyle and getting preventive care are important in promoting health and wellness. Ask your health care provider about:  The right schedule for you to have regular tests and exams.  Things you can do on your own to prevent diseases and keep yourself healthy. What should I know about diet, weight, and exercise? Eat a healthy diet   Eat a diet that includes plenty of vegetables, fruits, low-fat dairy products, and lean protein.  Do not eat a lot of foods that are high in solid fats, added sugars, or sodium. Maintain a healthy weight Body mass index (BMI) is a measurement that can be used to identify possible weight problems. It estimates body fat based on height and weight. Your health care provider can help determine your BMI and help you achieve or maintain a healthy weight. Get regular exercise Get regular exercise. This is one of the most important things you can do for your health. Most adults should:  Exercise for at least 150 minutes each week. The exercise should increase your heart rate and make you sweat (moderate-intensity exercise).  Do strengthening exercises at least twice a week. This is in addition to the moderate-intensity exercise.  Spend less time sitting. Even light physical activity can be beneficial. Watch cholesterol and blood lipids Have your blood tested for lipids and cholesterol at 58 years of age, then have this test  every 5 years. You may need to have your cholesterol levels checked more often if:  Your lipid or cholesterol levels are high.  You are older than 58 years of age.  You are at high risk for heart disease. What should I know about cancer screening? Many types of cancers can be detected early and may often be prevented. Depending on your health history and family history, you may need to have cancer screening at various ages. This may include screening for:  Colorectal cancer.  Prostate cancer.  Skin cancer.  Lung cancer. What should I know about heart disease, diabetes, and high blood pressure? Blood pressure and heart disease  High blood pressure causes heart disease and increases the risk of stroke. This is more likely to develop in people who have high blood pressure readings, are of African descent, or are overweight.  Talk with your health care provider about your target blood pressure readings.  Have your blood pressure checked: ? Every 3-5 years if you are 72-63 years of age. ? Every year if you are 17 years old or older.  If you are between the ages of 68 and 3 and are a current or former smoker, ask your health care provider if you should have a one-time screening for abdominal aortic aneurysm (AAA). Diabetes Have regular diabetes screenings. This checks your fasting blood sugar level. Have the screening done:  Once every three years after age 58 if you are at a normal weight and have a low risk for diabetes.  More often and at a younger age if you are overweight  or have a high risk for diabetes. What should I know about preventing infection? Hepatitis B If you have a higher risk for hepatitis B, you should be screened for this virus. Talk with your health care provider to find out if you are at risk for hepatitis B infection. Hepatitis C Blood testing is recommended for:  Everyone born from [birth year] through 1964.  Anyone with known risk factors for hepatitis  C. Sexually transmitted infections (STIs)  You should be screened each year for STIs, including gonorrhea and chlamydia, if: ? You are sexually active and are younger than 58 years of age. ? You are older than 58 years of age and your health care provider tells you that you are at risk for this type of infection. ? Your sexual activity has changed since you were last screened, and you are at increased risk for chlamydia or gonorrhea. Ask your health care provider if you are at risk.  Ask your health care provider about whether you are at high risk for HIV. Your health care provider may recommend a prescription medicine to help prevent HIV infection. If you choose to take medicine to prevent HIV, you should first get tested for HIV. You should then be tested every 3 months for as long as you are taking the medicine. Follow these instructions at home: Lifestyle  Do not use any products that contain nicotine or tobacco, such as cigarettes, e-cigarettes, and chewing tobacco. If you need help quitting, ask your health care provider.  Do not use street drugs.  Do not share needles.  Ask your health care provider for help if you need support or information about quitting drugs. Alcohol use  Do not drink alcohol if your health care provider tells you not to drink.  If you drink alcohol: ? Limit how much you have to 0-2 drinks a day. ? Be aware of how much alcohol is in your drink. In the [place] one drink equals one 12 oz bottle of beer (355 mL), one 5 oz glass of wine (148 mL), or one 1 oz glass of hard liquor (44 mL). General instructions  Schedule regular health, dental, and eye exams.  Stay current with your vaccines.  Tell your health care provider if: ? You often feel depressed. ? You have ever been abused or do not feel safe at home. Summary  Adopting a healthy lifestyle and getting preventive care are important in promoting health and wellness.  Follow your health care  provider's instructions about healthy diet, exercising, and getting tested or screened for diseases.  Follow your health care provider's instructions on monitoring your cholesterol and blood pressure. This information is not intended to replace advice given to you by your health care provider. Make sure you discuss any questions you have with your health care provider. Document Revised: 08/21/2018 Document Reviewed: 08/21/2018 Elsevier Patient Education  2020 [employer].

## 2020-05-18 ENCOUNTER — Ambulatory Visit (INDEPENDENT_AMBULATORY_CARE_PROVIDER_SITE_OTHER): Payer: Managed Care, Other (non HMO) | Admitting: Internal Medicine

## 2020-05-18 ENCOUNTER — Other Ambulatory Visit: Payer: Self-pay

## 2020-05-18 ENCOUNTER — Encounter: Payer: Self-pay | Admitting: Internal Medicine

## 2020-05-18 VITALS — BP 120/78 | HR 70 | Temp 97.9°F | Ht 68.0 in | Wt 163.8 lb

## 2020-05-18 DIAGNOSIS — Z0000 Encounter for general adult medical examination without abnormal findings: Secondary | ICD-10-CM

## 2020-05-18 DIAGNOSIS — Z125 Encounter for screening for malignant neoplasm of prostate: Secondary | ICD-10-CM

## 2020-05-18 DIAGNOSIS — Z8673 Personal history of transient ischemic attack (TIA), and cerebral infarction without residual deficits: Secondary | ICD-10-CM

## 2020-05-18 DIAGNOSIS — K219 Gastro-esophageal reflux disease without esophagitis: Secondary | ICD-10-CM

## 2020-05-18 MED ORDER — OMEPRAZOLE 20 MG PO CPDR: 20.0000 mg | DELAYED_RELEASE_CAPSULE | Freq: Every day | ORAL | 3 refills | Status: DC

## 2020-05-18 MED ORDER — ATORVASTATIN CALCIUM 40 MG PO TABS
40.0000 mg | ORAL_TABLET | Freq: Every day | ORAL | 3 refills | Status: DC
Start: 2020-05-18 — End: 2021-01-25

## 2020-05-18 NOTE — Assessment & Plan Note (Signed)
Chronic Having some GERD and h/o of esophageal stricture Restart omeprazole 20 mg daily

## 2020-05-18 NOTE — Assessment & Plan Note (Signed)
Chronic With residual dysarthria and L facial droop, mild L had weakness Following with neuro On ASA 81 mg, statin continue

## 2020-05-19 LAB — COMPREHENSIVE METABOLIC PANEL
AG Ratio: 1.5 (calc) (ref 1.0–2.5)
ALT: 13 U/L (ref 9–46)
AST: 17 U/L (ref 10–35)
Albumin: 4.1 g/dL (ref 3.6–5.1)
Alkaline phosphatase (APISO): 67 U/L (ref 35–144)
BUN: 17 mg/dL (ref 7–25)
CO2: 28 mmol/L (ref 20–32)
Calcium: 9.1 mg/dL (ref 8.6–10.3)
Chloride: 104 mmol/L (ref 98–110)
Creat: 0.99 mg/dL (ref 0.70–1.33)
Globulin: 2.7 g/dL (calc) (ref 1.9–3.7)
Glucose, Bld: 75 mg/dL (ref 65–99)
Potassium: 4.1 mmol/L (ref 3.5–5.3)
Sodium: 141 mmol/L (ref 135–146)
Total Bilirubin: 0.6 mg/dL (ref 0.2–1.2)
Total Protein: 6.8 g/dL (ref 6.1–8.1)

## 2020-05-19 LAB — PSA, TOTAL AND FREE
PSA, % Free: 33 % (calc) (ref >25)
PSA, Free: 0.1 ng/mL
PSA, Total: 0.3 ng/mL (ref <=4.0)

## 2020-05-19 LAB — LIPID PANEL
Cholesterol: 122 mg/dL (ref <200)
HDL: 50 mg/dL (ref >=40)
LDL Cholesterol (Calc): 54 mg/dL (calc)
Non-HDL Cholesterol (Calc): 72 mg/dL (calc) (ref <130)
Total CHOL/HDL Ratio: 2.4 (calc) (ref <5.0)
Triglycerides: 95 mg/dL (ref <150)

## 2020-05-19 LAB — CBC WITH DIFFERENTIAL/PLATELET
Absolute Monocytes: 560 cells/uL (ref 200–950)
Basophils Absolute: 67 cells/uL (ref 0–200)
Basophils Relative: 1.2 %
Eosinophils Absolute: 487 cells/uL (ref 15–500)
Eosinophils Relative: 8.7 %
HCT: 43.4 % (ref 38.5–50.0)
Hemoglobin: 15.1 g/dL (ref 13.2–17.1)
Lymphs Abs: 1876 cells/uL (ref 850–3900)
MCH: 32.5 pg (ref 27.0–33.0)
MCHC: 34.8 g/dL (ref 32.0–36.0)
MCV: 93.3 fL (ref 80.0–100.0)
MPV: 9.5 fL (ref 7.5–12.5)
Monocytes Relative: 10 %
Neutro Abs: 2610 cells/uL (ref 1500–7800)
Neutrophils Relative %: 46.6 %
Platelets: 199 10*3/uL (ref 140–400)
RBC: 4.65 10*6/uL (ref 4.20–5.80)
RDW: 12.5 % (ref 11.0–15.0)
Total Lymphocyte: 33.5 %
WBC: 5.6 10*3/uL (ref 3.8–10.8)

## 2020-05-19 LAB — TSH: TSH: 1.96 mIU/L (ref 0.40–4.50)

## 2020-05-24 ENCOUNTER — Encounter: Payer: Self-pay | Admitting: Internal Medicine

## 2020-12-25 ENCOUNTER — Other Ambulatory Visit: Payer: Self-pay

## 2020-12-25 ENCOUNTER — Emergency Department (HOSPITAL_COMMUNITY)
Admission: EM | Admit: 2020-12-25 | Discharge: 2020-12-25 | Disposition: A | Discharge status: Home / Self Care | Payer: 59 | Attending: Emergency Medicine | Admitting: Emergency Medicine

## 2020-12-25 ENCOUNTER — Emergency Department (HOSPITAL_COMMUNITY): Payer: 59

## 2020-12-25 ENCOUNTER — Encounter (HOSPITAL_COMMUNITY): Payer: Self-pay | Admitting: Emergency Medicine

## 2020-12-25 DIAGNOSIS — R202 Paresthesia of skin: Secondary | ICD-10-CM | POA: Insufficient documentation

## 2020-12-25 DIAGNOSIS — R42 Dizziness and giddiness: Secondary | ICD-10-CM | POA: Insufficient documentation

## 2020-12-25 DIAGNOSIS — Z7982 Long term (current) use of aspirin: Secondary | ICD-10-CM | POA: Diagnosis not present

## 2020-12-25 DIAGNOSIS — F17220 Nicotine dependence, chewing tobacco, uncomplicated: Secondary | ICD-10-CM | POA: Diagnosis not present

## 2020-12-25 LAB — CBC
HCT: 42.5 % (ref 39.0–52.0)
Hemoglobin: 14.4 g/dL (ref 13.0–17.0)
MCH: 31.1 pg (ref 26.0–34.0)
MCHC: 33.9 g/dL (ref 30.0–36.0)
MCV: 91.8 fL (ref 80.0–100.0)
Platelets: 210 10*3/uL (ref 150–400)
RBC: 4.63 MIL/uL (ref 4.22–5.81)
RDW: 12.6 % (ref 11.5–15.5)
WBC: 6 10*3/uL (ref 4.0–10.5)
nRBC: 0 % (ref 0.0–0.2)

## 2020-12-25 LAB — BASIC METABOLIC PANEL
Anion gap: 6 (ref 5–15)
BUN: 10 mg/dL (ref 6–20)
CO2: 26 mmol/L (ref 22–32)
Calcium: 8.9 mg/dL (ref 8.9–10.3)
Chloride: 106 mmol/L (ref 98–111)
Creatinine, Ser: 0.95 mg/dL (ref 0.61–1.24)
GFR, Estimated: >60 mL/min (ref >60)
Glucose, Bld: 96 mg/dL (ref 70–99)
Potassium: 4 mmol/L (ref 3.5–5.1)
Sodium: 138 mmol/L (ref 135–145)

## 2020-12-25 LAB — CBG MONITORING, ED: Glucose-Capillary: 90 mg/dL (ref 70–99)

## 2020-12-25 LAB — URINALYSIS, ROUTINE W REFLEX MICROSCOPIC
Bilirubin Urine: NEGATIVE
Glucose, UA: NEGATIVE mg/dL
Hgb urine dipstick: NEGATIVE
Ketones, ur: NEGATIVE mg/dL
Leukocytes,Ua: NEGATIVE
Nitrite: NEGATIVE
Protein, ur: NEGATIVE mg/dL
Specific Gravity, Urine: 1.023 (ref 1.005–1.030)
pH: 5 (ref 5.0–8.0)

## 2020-12-25 LAB — TROPONIN I (HIGH SENSITIVITY): Troponin I (High Sensitivity): 3 ng/L (ref <18)

## 2020-12-25 NOTE — ED Triage Notes (Addendum)
Reports feeling hot and lightheaded while grocery shopping yesterday.  No arm drift.  Reports R arm tingling yesterday.  No symptoms at present.

## 2020-12-25 NOTE — ED Provider Notes (Signed)
[place] EMERGENCY DEPARTMENT Provider Note   CSN: [id number] Arrival date & time: 12/25/20  1248     History Chief Complaint  Patient presents with  . Dizziness    Nickalas Mccarrick is a 60 y.o. male.  HPI  Patient is a 60 year old male with a otherwise minimal medical history other than a stroke in February 2021 presenting with a chief complaint of recurrent dizziness and right arm tingling.  He states that similar to his previous stroke.  He denies any chest pain or shortness of breath, fevers or chills, nausea or vomiting.  Patient otherwise ambulatory tolerating p.o. intake of any difficulty. Patient is currently asymptomatic.  He has no history of heart disease. Discharged after his last evaluation on aspirin and atorvastatin. Past Medical History:  Diagnosis Date  . Acute CVA (cerebrovascular accident) ([place])   . Allergy   . Colonic polyp    adenomatous  . Esophageal stricture   . GERD (gastroesophageal reflux disease)   . Hemorrhage, anal or rectal   . Hiatal hernia   . Reflux esophagitis     Patient Active Problem List   Diagnosis Date Noted  . History of CVA (cerebrovascular accident) 10/23/2019  . Lower back pain 08/01/2019  . Dermatitis due to sun 02/07/2018  . Angular cheilosis 01/21/2018  . GASTROESOPHAGEAL REFLUX DISEASE 10/25/2007  . COLONIC POLYPS, ADENOMATOUS 08/20/2007  . ESOPHAGEAL STRICTURE 08/20/2007  . HIATAL HERNIA 08/20/2007    Past Surgical History:  Procedure Laterality Date  . COLONOSCOPY    . polyectomy  2008 & 2014  . POLYPECTOMY    . UPPER GASTROINTESTINAL ENDOSCOPY         Family History  Problem Relation Age of Onset  . Diabetes Mother   . Heart disease Father   . Diabetes Brother   . Heart disease Brother   . Colon cancer Neg Hx   . Stomach cancer Neg Hx   . Esophageal cancer Neg Hx   . Rectal cancer Neg Hx   . Liver cancer Neg Hx   . Pancreatic cancer Neg Hx     Social History   Tobacco Use   . Smoking status: Never Smoker  . Smokeless tobacco: Current User    Types: Snuff  Vaping Use  . Vaping Use: Never used  Substance Use Topics  . Alcohol use: Yes    Comment: occasionally  . Drug use: No    Home Medications Prior to Admission medications   Medication Sig Start Date End Date Taking? Authorizing Provider  aspirin EC 81 MG EC tablet Take 1 tablet (81 mg total) by mouth daily. 10/25/19   Black, Lezlie Octave, NP  atorvastatin (LIPITOR) 40 MG tablet Take 1 tablet (40 mg total) by mouth daily at 6 PM. 05/18/20   Burns, Claudina Lick, MD  omeprazole (PRILOSEC) 20 MG capsule Take 1 capsule (20 mg total) by mouth daily. 05/18/20   Binnie Rail, MD    Allergies    Patient has no known allergies.  Review of Systems   Review of Systems  Constitutional: Negative for chills and fever.  HENT: Negative for ear pain and sore throat.   Eyes: Negative for pain and visual disturbance.  Respiratory: Negative for cough and shortness of breath.   Cardiovascular: Negative for chest pain and palpitations.  Gastrointestinal: Negative for abdominal pain and vomiting.  Genitourinary: Negative for dysuria and hematuria.  Musculoskeletal: Negative for arthralgias and back pain.  Skin: Negative for color change and rash.  Neurological: Positive for light-headedness and numbness. Negative for seizures and syncope.  All other systems reviewed and are negative.   Physical Exam Updated Vital Signs BP (!) 132/94   Pulse (!) 51   Temp 97.8 F (36.6 C) (Oral)   Resp 16   SpO2 100%   Physical Exam Vitals and nursing note reviewed.  Constitutional:      Appearance: He is well-developed.  HENT:     Head: Normocephalic and atraumatic.     Nose: No congestion or rhinorrhea.     Mouth/Throat:     Mouth: Mucous membranes are moist.     Pharynx: Oropharynx is clear. No oropharyngeal exudate.  Eyes:     Conjunctiva/sclera: Conjunctivae normal.     Pupils: Pupils are equal, round, and reactive to light.   Cardiovascular:     Rate and Rhythm: Normal rate and regular rhythm.     Heart sounds: No murmur heard.   Pulmonary:     Effort: Pulmonary effort is normal. No respiratory distress.     Breath sounds: Normal breath sounds.  Abdominal:     Palpations: Abdomen is soft.     Tenderness: There is no abdominal tenderness.  Musculoskeletal:        General: No swelling, tenderness, deformity or signs of injury. Normal range of motion.     Cervical back: Neck supple. No rigidity or tenderness.  Skin:    General: Skin is warm and dry.  Neurological:     General: No focal deficit present.     Mental Status: He is alert and oriented to person, place, and time. Mental status is at baseline.     Cranial Nerves: No cranial nerve deficit.     Sensory: No sensory deficit.     Motor: No weakness.     Coordination: Coordination normal.     Gait: Gait normal.     Deep Tendon Reflexes: Reflexes normal.     ED Results / Procedures / Treatments   Labs (all labs ordered are listed, but only abnormal results are displayed) Labs Reviewed  BASIC METABOLIC PANEL  CBC  URINALYSIS, ROUTINE W REFLEX MICROSCOPIC  CBG MONITORING, ED  TROPONIN I (HIGH SENSITIVITY)    EKG EKG Interpretation  Date/Time:  Saturday December 25 2020 12:57:13 EDT Ventricular Rate:  71 PR Interval:  146 QRS Duration: 98 QT Interval:  398 QTC Calculation: 432 R Axis:   81 Text Interpretation: Normal sinus rhythm Normal ECG Confirmed by Lacretia Leigh ([id number]) on 12/25/2020 2:41:18 PM   Radiology MR BRAIN WO CONTRAST  Result Date: 12/25/2020 CLINICAL DATA:  Transient ischemic attack (TIA). Additional provided: Patient reports feeling hot and lightheaded while gross hree stopping yesterday. EXAM: MRI HEAD WITHOUT CONTRAST TECHNIQUE: Multiplanar, multiecho pulse sequences of the brain and surrounding structures were obtained without intravenous contrast. COMPARISON:  Brain MRI 10/24/2019. FINDINGS: Brain: Cerebral volume is  normal. Redemonstrated chronic lacunar infarct within the right basal ganglia/internal capsule. Minimal multifocal T2/FLAIR hyperintensity elsewhere within the cerebral white matter is nonspecific, but compatible with chronic small vessel ischemic disease. There is no acute infarct. No evidence of intracranial mass. No extra-axial fluid collection. No midline shift. Vascular: Expected proximal arterial flow voids. Skull and upper cervical spine: No focal marrow lesion. Sinuses/Orbits: Visualized orbits show no acute finding. Redemonstrated 1.7 cm polypoid focus within the superior aspect of the left nasal passage. Moderate left frontal sinus mucosal thickening. Trace bilateral ethmoid sinus mucosal thickening. Mild mucosal thickening within the bilateral maxillary sinuses. Superimposed bilateral maxillary  sinus mucous retention cysts. IMPRESSION: No evidence of acute intracranial abnormality, including acute infarction. Redemonstrated chronic lacunar infarct within the right basal ganglia/internal capsule. Minimal background chronic small vessel ischemic disease within the cerebral white matter. Redemonstrated 1.7 cm polypoid soft tissue focus within the superior aspect of the left nasal passage. Nonemergent ENT consultation is recommended, if not already obtained. Paranasal sinus disease as described. Electronically Signed   By: Kellie Simmering DO   On: 12/25/2020 17:08    Procedures Procedures   Medications Ordered in ED Medications - No data to display  ED Course  I have reviewed the triage vital signs and the nursing notes.  Pertinent labs & imaging results that were available during my care of the patient were reviewed by me and considered in my medical decision making (see chart for details).    MDM Rules/Calculators/A&P                           Medical Decision Making:  Davian Hanshaw is a 60 y.o. male with a history of CVA, who presented to the ED today with episode of tingling yesterday.    On my initial exam, the pt was in no acute distress. All symptoms resolved at this time.   Reviewed and confirmed nursing documentation for past medical history, family history, social history.   Initial Assessment:  With the patient's presentation of episode of dizziness yesterday, most likely diagnosis is orthostatic hypotension versus new onset TIA. Other diagnoses were considered including (but not limited to) CVA, ACS, cardiac abnormality. These are considered less likely due to history of present illness and physical exam findings.   Initial Plan: 1. Evaluate for cardiogenic source with EKG, single troponin given lack of symptoms today.  Results with no acute abnormalities at this time. 2. Additionally proceeded with MRI head to evaluate for TIA or other intracranial process given similarities between this episode and previous stroke. MRI resulted with no acute abnormalities at this time.  Objective evaluation as below reviewed independently and with attending guidance.   Final Assessment and Plan:  Patient continues to be asymptomatic and is stable for continued outpatient follow-up and management with his primary care provider and neurologist in the outpatient setting. Disposition: Based on the above findings, I believe patient is stable for discharge.   Patient/family educated about specific return precautions for given chief complaint and symptoms.  Patient/family educated about follow-up with PCP and neurologist.  Patient/family expressed understanding of return precautions and need for follow-up. Patient spoken to regarding all imaging and laboratory results and appropriate follow up for these results. All education provided in verbal form with additional information in written form. Time was allowed for answering of patient questions. Patient discharged.   Emergency Department Medication Summary: Medications - No data to display    Final Clinical Impression(s) / ED  Diagnoses Final diagnoses:  Dizziness    Rx / DC Orders ED Discharge Orders    None       Tretha Sciara, MD 12/25/20 Artist Pais    Blanchie Dessert, MD 12/25/20 [id number]

## 2020-12-25 NOTE — ED Notes (Signed)
E-signature pad unavailable at time of pt discharge. This RN discussed discharge materials with pt and answered all pt questions. Pt stated understanding of discharge material. ? ?

## 2020-12-25 NOTE — ED Notes (Signed)
Patient transported to MRI 

## 2020-12-25 NOTE — Discharge Instructions (Signed)
You were evaluated for episode of dizziness earlier today now completely resolved.  Your evaluation included an MRI to look for evidence of recurrent infarction and an EKG and troponin to look for cardiac source of your symptoms.  Ultimately your evaluation is reassuring today.  Is important you follow-up with your primary care provider about the symptoms as well as your neurologist to ensure you are fully optimized against future strokes.

## 2021-01-05 ENCOUNTER — Ambulatory Visit: Payer: 59 | Admitting: Internal Medicine

## 2021-01-10 ENCOUNTER — Ambulatory Visit: Payer: 59 | Admitting: Internal Medicine

## 2021-01-11 ENCOUNTER — Ambulatory Visit: Payer: 59 | Admitting: Internal Medicine

## 2021-01-18 DIAGNOSIS — R202 Paresthesia of skin: Secondary | ICD-10-CM | POA: Insufficient documentation

## 2021-01-18 DIAGNOSIS — R42 Dizziness and giddiness: Secondary | ICD-10-CM | POA: Insufficient documentation

## 2021-01-18 NOTE — Progress Notes (Signed)
Subjective:    Patient ID: Jeffrey [name], male    DOB: 06/02/1961, 60 y.o.   MRN: [id number]  HPI The patient is here for follow up from the ED  12/25/20 went to ED for dizziness.   He went to ED for recurrent dizziness and right arm tingling that occurred the day prior.    He had pain in his left elbow area for one week or so.  He had funny feelings in his arm.  He just felt off.  He got a transient feeling of dizziness when in the grocery store.  This was similar to his previous stroke.  He ended up waiting a couple of days but was concerned and went to the ED.    In the ED - He denied other symptoms.  no symptoms on presentation.  Physical exam was normal.  EKG normal.  blood work WNL  MRI w/o acute abnormality.   Most likely orthostatic hypotension vs new onset TIA.    He denies any recurrence.  He wonders if he was leaning too much on his right arm when working and that caused his tingling.      Medications and allergies reviewed with patient and updated if appropriate.  Patient Active Problem List   Diagnosis Date Noted  . History of CVA (cerebrovascular accident) 10/23/2019  . Lower back pain 08/01/2019  . Dermatitis due to sun 02/07/2018  . Angular cheilosis 01/21/2018  . GASTROESOPHAGEAL REFLUX DISEASE 10/25/2007  . COLONIC POLYPS, ADENOMATOUS 08/20/2007  . ESOPHAGEAL STRICTURE 08/20/2007  . HIATAL HERNIA 08/20/2007    Current Outpatient Medications on File Prior to Visit  Medication Sig Dispense Refill  . aspirin EC 81 MG EC tablet Take 1 tablet (81 mg total) by mouth daily.    Marland Kitchen atorvastatin (LIPITOR) 40 MG tablet Take 1 tablet (40 mg total) by mouth daily at 6 PM. 90 tablet 3  . omeprazole (PRILOSEC) 20 MG capsule Take 1 capsule (20 mg total) by mouth daily. 90 capsule 3   No current facility-administered medications on file prior to visit.    Past Medical History:  Diagnosis Date  . Acute CVA (cerebrovascular accident) ([place])   . Allergy   . Colonic  polyp    adenomatous  . Esophageal stricture   . GERD (gastroesophageal reflux disease)   . Hemorrhage, anal or rectal   . Hiatal hernia   . Reflux esophagitis     Past Surgical History:  Procedure Laterality Date  . COLONOSCOPY    . polyectomy  2008 & 2014  . POLYPECTOMY    . UPPER GASTROINTESTINAL ENDOSCOPY      Social History   Socioeconomic History  . Marital status: Married    Spouse name: Not on file  . Number of children: Not on file  . Years of education: Not on file  . Highest education level: Not on file  Occupational History  . Not on file  Tobacco Use  . Smoking status: Never Smoker  . Smokeless tobacco: Current User    Types: Snuff  Vaping Use  . Vaping Use: Never used  Substance and Sexual Activity  . Alcohol use: Yes    Comment: occasionally  . Drug use: No  . Sexual activity: Not on file  Other Topics Concern  . Not on file  Social History Narrative   [employer] a lot      No regular exercise   Social Determinants of Health   Financial Resource Strain: Not on file  Food Insecurity: Not on file  Transportation Needs: Not on file  Physical Activity: Not on file  Stress: Not on file  Social Connections: Not on file    Family History  Problem Relation Age of Onset  . Diabetes Mother   . Heart disease Father   . Diabetes Brother   . Heart disease Brother   . Colon cancer Neg Hx   . Stomach cancer Neg Hx   . Esophageal cancer Neg Hx   . Rectal cancer Neg Hx   . Liver cancer Neg Hx   . Pancreatic cancer Neg Hx     Review of Systems  Constitutional: Negative for fever.  Eyes: Negative for visual disturbance.  Respiratory: Negative for cough, shortness of breath and wheezing.   Cardiovascular: Negative for chest pain and palpitations.  Neurological: Negative for weakness, light-headedness, numbness and headaches.       Objective:  There were no vitals filed for this visit. BP Readings from Last 3 Encounters:  12/25/20  134/90  05/18/20 120/78  04/07/20 119/82   Wt Readings from Last 3 Encounters:  05/18/20 163 lb 12.8 oz (74.3 kg)  04/07/20 156 lb (70.8 kg)  12/02/19 165 lb 12.8 oz (75.2 kg)   There is no height or weight on file to calculate BMI.   Physical Exam Constitutional:      General: He is not in acute distress.    Appearance: Normal appearance. He is not ill-appearing.  HENT:     Head: Normocephalic and atraumatic.  Cardiovascular:     Rate and Rhythm: Normal rate and regular rhythm.     Heart sounds: No murmur heard.   Pulmonary:     Effort: Pulmonary effort is normal. No respiratory distress.     Breath sounds: No wheezing or rales.  Musculoskeletal:     Right lower leg: No edema.     Left lower leg: No edema.  Skin:    General: Skin is warm and dry.  Neurological:     General: No focal deficit present.     Mental Status: He is alert and oriented to person, place, and time.     Cranial Nerves: No cranial nerve deficit.     Sensory: No sensory deficit.     Motor: No weakness.  Psychiatric:        Mood and Affect: Mood normal.         MR BRAIN WO CONTRAST CLINICAL DATA:  Transient ischemic attack (TIA). Additional provided: Patient reports feeling hot and lightheaded while gross hree stopping yesterday.  EXAM: MRI HEAD WITHOUT CONTRAST  TECHNIQUE: Multiplanar, multiecho pulse sequences of the brain and surrounding structures were obtained without intravenous contrast.  COMPARISON:  Brain MRI 10/24/2019.  FINDINGS: Brain:  Cerebral volume is normal.  Redemonstrated chronic lacunar infarct within the right basal ganglia/internal capsule.  Minimal multifocal T2/FLAIR hyperintensity elsewhere within the cerebral white matter is nonspecific, but compatible with chronic small vessel ischemic disease.  There is no acute infarct.  No evidence of intracranial mass.  No extra-axial fluid collection.  No midline shift.  Vascular: Expected proximal arterial  flow voids.  Skull and upper cervical spine: No focal marrow lesion.  Sinuses/Orbits: Visualized orbits show no acute finding. Redemonstrated 1.7 cm polypoid focus within the superior aspect of the left nasal passage. Moderate left frontal sinus mucosal thickening. Trace bilateral ethmoid sinus mucosal thickening. Mild mucosal thickening within the bilateral maxillary sinuses. Superimposed bilateral maxillary sinus mucous retention cysts.  IMPRESSION: No evidence of acute  intracranial abnormality, including acute infarction.  Redemonstrated chronic lacunar infarct within the right basal ganglia/internal capsule.  Minimal background chronic small vessel ischemic disease within the cerebral white matter.  Redemonstrated 1.7 cm polypoid soft tissue focus within the superior aspect of the left nasal passage. Nonemergent ENT consultation is recommended, if not already obtained.  Paranasal sinus disease as described.  Electronically Signed   By: Kellie Simmering DO   On: 12/25/2020 17:08     Assessment & Plan:    See Problem List for Assessment and Plan of chronic medical problems.    This visit occurred during the SARS-CoV-2 public health emergency.  Safety protocols were in place, including screening questions prior to the visit, additional usage of staff PPE, and extensive cleaning of exam room while observing appropriate contact time as indicated for disinfecting solutions.

## 2021-01-19 ENCOUNTER — Encounter: Payer: Self-pay | Admitting: Internal Medicine

## 2021-01-19 ENCOUNTER — Ambulatory Visit (INDEPENDENT_AMBULATORY_CARE_PROVIDER_SITE_OTHER): Payer: BC Managed Care – PPO | Admitting: Internal Medicine

## 2021-01-19 ENCOUNTER — Other Ambulatory Visit: Payer: Self-pay

## 2021-01-19 VITALS — BP 116/74 | HR 83 | Temp 98.1°F | Ht 68.0 in | Wt 164.0 lb

## 2021-01-19 DIAGNOSIS — R202 Paresthesia of skin: Secondary | ICD-10-CM

## 2021-01-19 DIAGNOSIS — J339 Nasal polyp, unspecified: Secondary | ICD-10-CM | POA: Diagnosis not present

## 2021-01-19 DIAGNOSIS — R42 Dizziness and giddiness: Secondary | ICD-10-CM

## 2021-01-19 DIAGNOSIS — Z8673 Personal history of transient ischemic attack (TIA), and cerebral infarction without residual deficits: Secondary | ICD-10-CM | POA: Diagnosis not present

## 2021-01-19 NOTE — Assessment & Plan Note (Signed)
Acute Episode while in the grocery store - no further episodes Unlikely TIA ? Cause - he did not eat or drink anything for a several hours - advised avoiding hours of not eating/drinking He will monitor No change in medication Sees neuro in July

## 2021-01-19 NOTE — Patient Instructions (Addendum)
  Medications changes include :   none    A referral was ordered for ENT - Dr Lucia Gaskins.       Someone from their office will call you to schedule an appointment.

## 2021-01-19 NOTE — Assessment & Plan Note (Signed)
Acute Not likely a TIA - previous imaging was very good and previous stroke was related to cocaine use ---- more likely related to pressure on his right when working He will monitor No change in medications Sees neuro in July

## 2021-01-19 NOTE — Assessment & Plan Note (Signed)
continue atorvastatin 40 mg daily and ASA 81 mg daily

## 2021-01-25 ENCOUNTER — Other Ambulatory Visit: Payer: Self-pay

## 2021-01-25 ENCOUNTER — Telehealth: Payer: Self-pay | Admitting: Internal Medicine

## 2021-01-25 MED ORDER — ATORVASTATIN CALCIUM 40 MG PO TABS: 40.0000 mg | ORAL_TABLET | Freq: Every day | ORAL | 3 refills | Status: DC

## 2021-01-25 MED ORDER — OMEPRAZOLE 20 MG PO CPDR: 20.0000 mg | DELAYED_RELEASE_CAPSULE | Freq: Every day | ORAL | 3 refills | Status: DC

## 2021-01-25 NOTE — Telephone Encounter (Signed)
Scripts faxed in for patient today.

## 2021-01-25 NOTE — Telephone Encounter (Signed)
Patient called and said that he will start to use [facility] for atorvastatin (LIPITOR) 40 MG tablet and omeprazole (PRILOSEC) 20 MG capsule. He said that they would be contacting the office. Please advise

## 2021-02-21 ENCOUNTER — Ambulatory Visit (INDEPENDENT_AMBULATORY_CARE_PROVIDER_SITE_OTHER): Payer: BC Managed Care – PPO | Admitting: Otolaryngology

## 2021-03-23 ENCOUNTER — Ambulatory Visit: Payer: BC Managed Care – PPO | Admitting: Neurology

## 2021-03-23 ENCOUNTER — Encounter: Payer: Self-pay | Admitting: Neurology

## 2021-03-24 DIAGNOSIS — D3130 Benign neoplasm of unspecified choroid: Secondary | ICD-10-CM | POA: Diagnosis not present

## 2021-03-31 ENCOUNTER — Ambulatory Visit (INDEPENDENT_AMBULATORY_CARE_PROVIDER_SITE_OTHER): Payer: BC Managed Care – PPO | Admitting: Otolaryngology

## 2021-04-20 ENCOUNTER — Ambulatory Visit (INDEPENDENT_AMBULATORY_CARE_PROVIDER_SITE_OTHER): Payer: BC Managed Care – PPO | Admitting: Otolaryngology

## 2021-05-05 ENCOUNTER — Ambulatory Visit (INDEPENDENT_AMBULATORY_CARE_PROVIDER_SITE_OTHER): Payer: BC Managed Care – PPO | Admitting: Otolaryngology

## 2021-06-09 ENCOUNTER — Ambulatory Visit (INDEPENDENT_AMBULATORY_CARE_PROVIDER_SITE_OTHER): Payer: BC Managed Care – PPO | Admitting: Otolaryngology

## 2021-06-16 ENCOUNTER — Encounter: Payer: Self-pay | Admitting: Family Medicine

## 2021-06-16 ENCOUNTER — Telehealth (INDEPENDENT_AMBULATORY_CARE_PROVIDER_SITE_OTHER): Payer: BC Managed Care – PPO | Admitting: Family Medicine

## 2021-06-16 DIAGNOSIS — R197 Diarrhea, unspecified: Secondary | ICD-10-CM

## 2021-06-16 NOTE — Progress Notes (Signed)
Virtual Visit via Video Note  I connected with Bill  on 06/16/21 at 11:00 AM EDT by a video enabled telemedicine application and verified that I am speaking with the correct person using two identifiers.  Location patient: home, [place] Location provider:work or home office Persons participating in the virtual visit: patient, provider  I discussed the limitations of evaluation and management by telemedicine and the availability of in person appointments. The patient expressed understanding and agreed to proceed.   HPI:  Acute telemedicine visit for Diarrhea: -Onset: about 4 days ago -covid test negative last night -Symptoms include: intermittent stomach cramps, some diarrhea initially -is eating and drinking, feels like he is doing better now -reports no vomiting today and stool is more formed today -Denies:fevers, blood in the stool, melena, inability to eat/drink/get out of bed, and focal or persistent abd pain, resp symptoms, body aches -ate [place] last week - but nobody who ate with him got sick -Pertinent past medical history:see below -Pertinent medication allergies: No Known Allergies -COVID-19 vaccine status:had 2 doses and 2 boosters  ROS: See pertinent positives and negatives per HPI.  Past Medical History:  Diagnosis Date   Acute CVA (cerebrovascular accident) [facility])    Allergy    Colonic polyp    adenomatous   Esophageal stricture    GERD (gastroesophageal reflux disease)    Hemorrhage, anal or rectal    Hiatal hernia    Reflux esophagitis     Past Surgical History:  Procedure Laterality Date   COLONOSCOPY     polyectomy  2008 & 2014   POLYPECTOMY     UPPER GASTROINTESTINAL ENDOSCOPY       Current Outpatient Medications:    aspirin EC 81 MG EC tablet, Take 1 tablet (81 mg total) by mouth daily., Disp:  , Rfl:    atorvastatin (LIPITOR) 40 MG tablet, Take 1 tablet (40 mg total) by mouth daily at 6 PM., Disp: 90 tablet, Rfl: 3   omeprazole (PRILOSEC)  20 MG capsule, Take 1 capsule (20 mg total) by mouth daily., Disp: 90 capsule, Rfl: 3  EXAM:  VITALS per patient if applicable:  GENERAL: alert, oriented, appears well and in no acute distress  HEENT: atraumatic, conjunttiva clear, no obvious abnormalities on inspection of external nose and ears  NECK: normal movements of the head and neck  LUNGS: on inspection no signs of respiratory distress, breathing rate appears normal, no obvious gross SOB, gasping or wheezing  CV: no obvious cyanosis  MS: moves all visible extremities without noticeable abnormality  PSYCH/NEURO: pleasant and cooperative, no obvious depression or anxiety, speech and thought processing grossly intact  ASSESSMENT AND PLAN:  Discussed the following assessment and plan:  Diarrhea, unspecified type  -we discussed possible serious and likely etiologies, options for evaluation and workup, limitations of telemedicine visit vs in person visit, treatment, treatment risks and precautions. Pt is agreeable to treatment via telemedicine at this moment. Query gastroenteritis vs other. He opted to try oral hydration, Imodium, no dairy or red meet. Precautions discussed.Advised to seek prompt in person care if worsening, new symptoms arise, or if is not improving with treatment over the next few days. Discussed options for inperson care if PCP office not available   I discussed the assessment and treatment plan with the patient. The patient was provided an opportunity to ask questions and all were answered. The patient agreed with the plan and demonstrated an understanding of the instructions.     Lucretia Kern, DO

## 2021-06-16 NOTE — Patient Instructions (Addendum)
-  no dairy or red meat  -plenty of water  -iodium as needed  I hope you are feeling better soon!  Seek in person care promptly if your symptoms worsen, new concerns arise or you are not improving with treatment.  It was nice to meet you today. I help [facility] out with telemedicine visits on Tuesdays and Thursdays and am available for visits on those days. If you have any concerns or questions following this visit please schedule a follow up visit with your Primary Care doctor or seek care at a local urgent care clinic to avoid delays in care.

## 2021-06-20 ENCOUNTER — Telehealth: Payer: Self-pay

## 2021-06-20 NOTE — Telephone Encounter (Signed)
Referral was put in for Dr. Lucia Gaskins ear nose and throat. Patient states that he has been notified that Dr. Lucia Gaskins is retiring the end of the month.  Whom does Dr. Quay Burow recommend?  Dr. Yvette Rack? [facility] or somewhere else.

## 2021-06-20 NOTE — Telephone Encounter (Signed)
Dr Benjamine Mola or the [facility] on [address]

## 2021-06-21 ENCOUNTER — Ambulatory Visit (INDEPENDENT_AMBULATORY_CARE_PROVIDER_SITE_OTHER): Payer: BC Managed Care – PPO | Admitting: Otolaryngology

## 2021-06-21 ENCOUNTER — Encounter (INDEPENDENT_AMBULATORY_CARE_PROVIDER_SITE_OTHER): Payer: Self-pay

## 2021-06-21 NOTE — Telephone Encounter (Signed)
Voicemail not set up so unable to leave message for patient.  My-chart message sent with suggestions.

## 2021-06-28 ENCOUNTER — Ambulatory Visit (INDEPENDENT_AMBULATORY_CARE_PROVIDER_SITE_OTHER): Payer: BC Managed Care – PPO | Admitting: Neurology

## 2021-06-28 ENCOUNTER — Encounter: Payer: Self-pay | Admitting: Neurology

## 2021-06-28 VITALS — BP 127/83 | HR 68 | Ht 68.0 in | Wt 168.0 lb

## 2021-06-28 DIAGNOSIS — E785 Hyperlipidemia, unspecified: Secondary | ICD-10-CM | POA: Diagnosis not present

## 2021-06-28 DIAGNOSIS — I6990 Unspecified sequelae of unspecified cerebrovascular disease: Secondary | ICD-10-CM | POA: Diagnosis not present

## 2021-06-28 DIAGNOSIS — R7309 Other abnormal glucose: Secondary | ICD-10-CM | POA: Diagnosis not present

## 2021-06-28 DIAGNOSIS — R799 Abnormal finding of blood chemistry, unspecified: Secondary | ICD-10-CM | POA: Diagnosis not present

## 2021-06-28 NOTE — Patient Instructions (Signed)
I had a long d/w patient about his remote lacunar stroke, risk for recurrent stroke/TIAs, personally independently reviewed imaging studies and stroke evaluation results and answered questions.Continue aspirin 81 mg daily  for secondary stroke prevention and maintain strict control of hypertension with blood pressure goal below 130/90, diabetes with hemoglobin A1c goal below 6.5% and lipids with LDL cholesterol goal below 70 mg/dL. I also advised the patient to eat a healthy diet with plenty of whole grains, cereals, fruits and vegetables, exercise regularly and maintain ideal body weight .check screening lipid profile and hemoglobin A1c as well as carotid ultrasound study.  Followup in the future with me in 1 year or call earlier if necessary.

## 2021-06-28 NOTE — Progress Notes (Signed)
[facility] [address] [place]. [place] [address] [phone]       STROKE FOLLOW UP NOTE  [name] Date of Birth:  [birth year] Medical Record Number:  [id number]   Reason for Referral: stroke follow up    CHIEF COMPLAINT:  Chief Complaint  Patient presents with   New Patient (Initial Visit)    Rm 16, alone, states he is doing well, would like to discuss stroke prevention     HPI: Update 06/28/2021 ; He returns for follow-up after last visit [name] than a year ago.  He states he is doing well.  He has not had any definite stroke or TIA symptoms however he had an episode on 12/25/2020  when he was seen in the ER for sweatiness, feeling hot and lightheaded.  He had had a busy day and had not eaten well.  He also had some feeling of dizziness and right sided tingling.  He underwent extensive work-up including EKG, cardiac enzymes which were negative and an MRI scan of the brain which showed no acute abnormality.  He states his fine motor skills have improved and has been typing at his previous.  Now.  Still has some occasional heightened emotions and can cry easily while watching the side seen listening to sound.  This is not bothersome or interfering with his day-to-day activities.  His speech is back to baseline.  He is tolerating aspirin well without bruising or bleeding.  His blood pressure under good control today it is 127/83.  He has not had any recent follow-up lipid profile, carotid ultrasound check.  He has no new complaints. Last Visit  04/07/2020, [name] returns for stroke follow-up.  He has been doing well since prior visit with great improvement of prior stroke deficits.  Reports occasional dysarthria typically when speaking too quickly and very slight decrease left hand dexterity but does not interfere with daily activities.  He also reports since his stroke, he has been experiencing heightened emotions such as crying seeing a sad movie or  listening to a song.  Did not experience this prior.  Denies symptoms of depression or anxiety.  Denies new stroke/TIA symptoms.  Continues on aspirin and atorvastatin without side effects.  Blood pressure today 119/82.  Has follow-up next month with PCP.  No further concerns.    History provided for reference purposes only Initial visit 12/02/2019 JM: Jeffrey [name] is a 60 year old male who is being seen today for hospital follow-up.  He has been recovering well from a stroke standpoint with residual mild left facial droop, mild dysarthria and decreased left hand dexterity but does report overall improvement.  He recently completed speech therapy but continues to do exercises at home.  He does report intermittent RUE "cold sensation" in forearm which he was experiencing initially upon presentation to ED.  He denies pain, weakness or numbness/tingling.  He has returned back to all prior activities without difficulty including working.  Completed 3 weeks DAPT and continues on aspirin alone without bleeding or bruising.  Continues on atorvastatin 40 mg daily without myalgias.  He does question if atorvastatin could be causing RUE symptoms and if he needs a change to a different statin.  Blood pressure today 122/88.  He reports ongoing smokeless tobacco use but has greatly decreased use with hopeful near future complete cessation.  Endorses complete cessation of cocaine and denies any other recreational drug use.  No concerns at this time.  Stroke admission 10/23/2019: Jeffrey [name] is a  60 y.o. male with no significant past medical history presented on 10/23/2019 with L facial droop and dysarthria.  Evaluated by stroke team and Dr. Erlinda Hong with stroke work-up revealing right BG/PLIC small infarct secondary to small vessel disease source.  Recommended DAPT for 3 weeks and aspirin alone.  UDS positive for cocaine with patient admitting to intermittent cocaine use with cocaine cessation education provided.  LDL 99  initiate atorvastatin 40 mg daily.  No history of HTN or DM.  Other stroke risk factors include smokeless tobacco use and EtOH use but no prior history of stroke.  Residual deficits of left facial droop, mild dysarthria and left hand dysmetria and discharged home in stable condition with recommendation of outpatient therapy.  Stroke:   R BG/PLIC small infarct secondary to small vessel disease source Code Stroke CT head No acute abnormality. sinus dz. ASPECTS 10.    CTA head & neck normal MRI  R basal ganglia / PLIC infarct  2D Echo EF 60-65%. No source of embolus  LDL 99 HgbA1c 5.0 Lovenox 40 mg sq daily for VTE prophylaxis No antithrombotic prior to admission, now on aspirin 81 mg daily and clopidogrel 75 mg daily following load. Continue DAPT x 3 weeks then ASPIRIN alone   Therapy recommendations:  OP SLP, no PT Disposition:  Return home       ROS:   14 system review of systems performed and negative with exception of dysarthria, decreased right hand dexterity, and facial weakness  PMH:  Past Medical History:  Diagnosis Date   Acute CVA (cerebrovascular accident) ([place])    Allergy    Colonic polyp    adenomatous   Esophageal stricture    GERD (gastroesophageal reflux disease)    Hemorrhage, anal or rectal    Hiatal hernia    Reflux esophagitis     PSH:  Past Surgical History:  Procedure Laterality Date   COLONOSCOPY     polyectomy  2008 & 2014   POLYPECTOMY     UPPER GASTROINTESTINAL ENDOSCOPY      Social History:  Social History   Socioeconomic History   Marital status: Married    Spouse name: Not on file   Number of children: Not on file   Years of education: Not on file   Highest education level: Not on file  Occupational History   Not on file  Tobacco Use   Smoking status: Never   Smokeless tobacco: Current    Types: Snuff  Vaping Use   Vaping Use: Never used  Substance and Sexual Activity   Alcohol use: Yes    Comment: occasionally   Drug use: No    Sexual activity: Not on file  Other Topics Concern   Not on file  Social History Narrative   Um[employer] softball a lot      No regular exercise   Social Determinants of Health   Financial Resource Strain: Not on file  Food Insecurity: Not on file  Transportation Needs: Not on file  Physical Activity: Not on file  Stress: Not on file  Social Connections: Not on file  Intimate Partner Violence: Not on file    Family History:  Family History  Problem Relation Age of Onset   Diabetes Mother    Heart disease Father    Diabetes Brother    Heart disease Brother    Colon cancer Neg Hx    Stomach cancer Neg Hx    Esophageal cancer Neg Hx    Rectal cancer Neg Hx  Liver cancer Neg Hx    Pancreatic cancer Neg Hx     Medications:   Current Outpatient Medications on File Prior to Visit  Medication Sig Dispense Refill   aspirin EC 81 MG EC tablet Take 1 tablet (81 mg total) by mouth daily.     atorvastatin (LIPITOR) 40 MG tablet Take 1 tablet (40 mg total) by mouth daily at 6 PM. 90 tablet 3   omeprazole (PRILOSEC) 20 MG capsule Take 1 capsule (20 mg total) by mouth daily. 90 capsule 3   No current facility-administered medications on file prior to visit.    Allergies:  No Known Allergies   Physical Exam  Vitals:   06/28/21 0932  BP: 127/83  Pulse: 68  Weight: 168 lb (76.2 kg)  Height: 5\' 8"  (1.727 m)   Body mass index is 25.54 kg/m. No results found.   General: well developed, well nourished, pleasant middle-age Caucasian male, seated, in no evident distress Head: head normocephalic and atraumatic.   Neck: supple with no carotid or supraclavicular bruits Cardiovascular: regular rate and rhythm, no murmurs Musculoskeletal: no deformity Skin:  no rash/petichiae Vascular:  Normal pulses all extremities   Neurologic Exam Mental Status: Awake and fully alert.   Occasional slight dysarthria with speaking to quickly.  Oriented to place and time. Recent and remote  memory intact. Attention span, concentration and fund of knowledge appropriate. Mood and affect appropriate.  Cranial Nerves: Pupils equal, briskly reactive to light. Extraocular movements full without nystagmus. Visual fields full to confrontation. Hearing intact. Facial sensation intact.  Mild left nasolabial fold flattening Motor: Normal bulk and tone. Normal strength in all tested extremity muscles  Sensory.: intact to touch , pinprick , position and vibratory sensation.  Coordination: Rapid alternating movements normal in all extremities except very slightly decreased left hand.  Finger-to-nose and heel-to-shin performed accurately bilaterally. Gait and Station: Arises from chair without difficulty. Stance is normal. Gait demonstrates normal stride length and balance Reflexes: 1+ and symmetric. Toes downgoing.       ASSESSMENT: Jeffrey [name] is a 60 y.o. year old male presented with left facial droop and dysarthria on 10/23/2019 with stroke work-up revealing right BG/PLIC stroke secondary to small vessel disease source. Vascular risk factors include HLD, smokeless tobacco use and cocaine use.  He is doing well with practically no residual deficits    PLAN:  I had a long d/w patient about his remote lacunar stroke, risk for recurrent stroke/TIAs, personally independently reviewed imaging studies and stroke evaluation results and answered questions.Continue aspirin 81 mg daily  for secondary stroke prevention and maintain strict control of hypertension with blood pressure goal below 130/90, diabetes with hemoglobin A1c goal below 6.5% and lipids with LDL cholesterol goal below 70 mg/dL. I also advised the patient to eat a healthy diet with plenty of whole grains, cereals, fruits and vegetables, exercise regularly and maintain ideal body weight .check screening lipid profile and hemoglobin A1c as well as carotid ultrasound study.  Followup in the future with me in 1 year or call earlier if  necessary.   I spent 25 minutes of face-to-face and non-face-to-face time with patient.  This included previsit chart review, lab review, study review, order entry, electronic health record documentation, patient education regarding history of stroke, possible PBA vs mild post stroke depression, importance of managing stroke risk factors and answered all questions to patient satisfaction   Antony Contras, MD  [facility] [address] [place], [place] [address]  Phone  [phone] Fax [phone] Note: This document was prepared with digital dictation and possible smart phrase technology. Any transcriptional errors that result from this process are unintentional.

## 2021-06-29 ENCOUNTER — Telehealth: Payer: Self-pay | Admitting: Neurology

## 2021-06-29 LAB — LIPID PANEL
Chol/HDL Ratio: 3 ratio (ref 0.0–5.0)
Cholesterol, Total: 110 mg/dL (ref 100–199)
HDL: 37 mg/dL — ABNORMAL LOW (ref >39)
LDL Chol Calc (NIH): 54 mg/dL (ref 0–99)
Triglycerides: 99 mg/dL (ref 0–149)
VLDL Cholesterol Cal: 19 mg/dL (ref 5–40)

## 2021-06-29 LAB — HEMOGLOBIN A1C
Est. average glucose Bld gHb Est-mCnc: 111 mg/dL
Hgb A1c MFr Bld: 5.5 % (ref 4.8–5.6)

## 2021-06-29 NOTE — Telephone Encounter (Signed)
[employer] auth: [place] ref # [id number], sent message to Butch Penny she will reach out to the patient to schedule.

## 2021-06-30 ENCOUNTER — Telehealth: Payer: Self-pay | Admitting: Neurology

## 2021-06-30 ENCOUNTER — Encounter: Payer: Self-pay | Admitting: *Deleted

## 2021-06-30 NOTE — Telephone Encounter (Signed)
Pt called has some questions about his labs that were done. More so his "HDL" result. Pt requesting a call back.

## 2021-06-30 NOTE — Telephone Encounter (Signed)
I spoke to the patient and notified him of Dr. Clydene Fake response. He was agreeable to add exercise.

## 2021-07-08 ENCOUNTER — Other Ambulatory Visit: Payer: Self-pay

## 2021-07-08 ENCOUNTER — Ambulatory Visit (HOSPITAL_COMMUNITY)
Admission: RE | Admit: 2021-07-08 | Discharge: 2021-07-08 | Disposition: A | Discharge status: Home / Self Care | Payer: BC Managed Care – PPO | Source: Ambulatory Visit | Attending: Neurology | Admitting: Neurology

## 2021-07-08 DIAGNOSIS — I6990 Unspecified sequelae of unspecified cerebrovascular disease: Secondary | ICD-10-CM | POA: Insufficient documentation

## 2021-07-08 NOTE — Progress Notes (Signed)
Carotid duplex bilateral study completed.   Please see CV Proc for preliminary results.   Tirrell Buchberger, RDMS, RVT  

## 2021-07-15 ENCOUNTER — Telehealth: Payer: Self-pay

## 2021-07-15 NOTE — Telephone Encounter (Signed)
Mychart message sent.

## 2021-07-15 NOTE — Telephone Encounter (Signed)
-----   Message from Garvin Fila, MD sent at 07/14/2021  5:07 PM EDT ----- Mitchell Heir inform the patient that carotid ultrasound study shows no significant blockages of either carotid artery in the neck ----- Message ----- From: Interface, [employer] Sent: 07/08/2021  10:32 AM EDT To: Garvin Fila, MD

## 2021-08-08 ENCOUNTER — Encounter: Payer: Self-pay | Admitting: Internal Medicine

## 2021-08-08 DIAGNOSIS — L820 Inflamed seborrheic keratosis: Secondary | ICD-10-CM | POA: Diagnosis not present

## 2021-08-08 DIAGNOSIS — D225 Melanocytic nevi of trunk: Secondary | ICD-10-CM | POA: Diagnosis not present

## 2021-08-08 DIAGNOSIS — D2262 Melanocytic nevi of left upper limb, including shoulder: Secondary | ICD-10-CM | POA: Diagnosis not present

## 2021-08-08 DIAGNOSIS — D2261 Melanocytic nevi of right upper limb, including shoulder: Secondary | ICD-10-CM | POA: Diagnosis not present

## 2021-08-08 DIAGNOSIS — D485 Neoplasm of uncertain behavior of skin: Secondary | ICD-10-CM | POA: Diagnosis not present

## 2021-08-08 DIAGNOSIS — D2272 Melanocytic nevi of left lower limb, including hip: Secondary | ICD-10-CM | POA: Diagnosis not present

## 2021-12-09 ENCOUNTER — Other Ambulatory Visit: Payer: Self-pay | Admitting: Internal Medicine

## 2022-02-09 DIAGNOSIS — J343 Hypertrophy of nasal turbinates: Secondary | ICD-10-CM | POA: Diagnosis not present

## 2022-02-09 DIAGNOSIS — J330 Polyp of nasal cavity: Secondary | ICD-10-CM | POA: Diagnosis not present

## 2022-02-09 DIAGNOSIS — J310 Chronic rhinitis: Secondary | ICD-10-CM | POA: Diagnosis not present

## 2022-04-21 DIAGNOSIS — D3130 Benign neoplasm of unspecified choroid: Secondary | ICD-10-CM | POA: Diagnosis not present

## 2022-06-13 ENCOUNTER — Ambulatory Visit: Payer: BC Managed Care – PPO | Admitting: Internal Medicine

## 2022-06-28 ENCOUNTER — Encounter: Payer: Self-pay | Admitting: Neurology

## 2022-06-28 ENCOUNTER — Ambulatory Visit: Payer: BC Managed Care – PPO | Admitting: Neurology

## 2022-06-30 ENCOUNTER — Ambulatory Visit: Payer: BC Managed Care – PPO | Admitting: Internal Medicine

## 2022-07-28 ENCOUNTER — Ambulatory Visit: Payer: BC Managed Care – PPO | Admitting: Internal Medicine

## 2022-08-07 DIAGNOSIS — D224 Melanocytic nevi of scalp and neck: Secondary | ICD-10-CM | POA: Diagnosis not present

## 2022-08-07 DIAGNOSIS — D225 Melanocytic nevi of trunk: Secondary | ICD-10-CM | POA: Diagnosis not present

## 2022-08-07 DIAGNOSIS — L821 Other seborrheic keratosis: Secondary | ICD-10-CM | POA: Diagnosis not present

## 2022-08-07 DIAGNOSIS — D1721 Benign lipomatous neoplasm of skin and subcutaneous tissue of right arm: Secondary | ICD-10-CM | POA: Diagnosis not present

## 2022-08-18 ENCOUNTER — Encounter: Payer: Self-pay | Admitting: Gastroenterology

## 2022-08-24 ENCOUNTER — Ambulatory Visit: Payer: BC Managed Care – PPO | Admitting: Internal Medicine

## 2022-08-25 ENCOUNTER — Ambulatory Visit: Payer: BC Managed Care – PPO | Admitting: Internal Medicine

## 2022-08-29 DIAGNOSIS — D1721 Benign lipomatous neoplasm of skin and subcutaneous tissue of right arm: Secondary | ICD-10-CM | POA: Diagnosis not present

## 2022-08-31 ENCOUNTER — Telehealth: Payer: Self-pay | Admitting: Neurology

## 2022-08-31 NOTE — Telephone Encounter (Addendum)
Pt called stating that he's is going to be having surgery soon and a clearance is needing to be faxed to the [facility] for [place] stating if it is ok for the pt to go under anesthesia and also if he can be cleared to be off of his aspirin EC 81 MG EC tablet for one week prior to surgery and 2 weeks after surgery. Please advise.  To be faxed at [phone]

## 2022-09-12 ENCOUNTER — Ambulatory Visit: Payer: BC Managed Care – PPO | Admitting: Internal Medicine

## 2022-09-18 ENCOUNTER — Ambulatory Visit: Payer: BC Managed Care – PPO | Admitting: Internal Medicine

## 2022-09-25 ENCOUNTER — Ambulatory Visit (AMBULATORY_SURGERY_CENTER): Payer: BC Managed Care – PPO | Admitting: *Deleted

## 2022-09-25 VITALS — Ht 68.0 in | Wt 168.0 lb

## 2022-09-25 DIAGNOSIS — Z86010 Personal history of colonic polyps: Secondary | ICD-10-CM

## 2022-09-25 MED ORDER — NA SULFATE-K SULFATE-MG SULF 17.5-3.13-1.6 GM/177ML PO SOLN: 1.0000 | Freq: Once | ORAL | 0 refills | Status: AC

## 2022-09-25 NOTE — Telephone Encounter (Signed)
I called pt. No answer, left a message asking pt to call me back.   

## 2022-09-25 NOTE — Progress Notes (Signed)
Pre visit conducted over telephone and instructions forwarded through [place].   No egg or soy allergy known to patient  No issues known to pt with past sedation with any surgeries or procedures Patient denies ever being told they had issues or difficulty with intubation  No FH of Malignant Hyperthermia Pt is not on diet pills Pt is not on  home 02  Pt is not on blood thinners  Pt denies issues with constipation  No A fib or A flutter   Pt instructed to use Singlecare.com or GoodRx for a price reduction on prep

## 2022-10-02 ENCOUNTER — Ambulatory Visit: Payer: BC Managed Care – PPO | Admitting: Internal Medicine

## 2022-10-03 NOTE — Telephone Encounter (Signed)
Pt returned phone call, would like a call back.

## 2022-10-04 ENCOUNTER — Encounter: Payer: Self-pay | Admitting: Internal Medicine

## 2022-10-05 ENCOUNTER — Telehealth: Payer: Self-pay | Admitting: Neurology

## 2022-10-05 NOTE — Telephone Encounter (Signed)
LVM and sent mychart msg informing pt of r/s needed for 2/7 appt- MD out.

## 2022-10-09 ENCOUNTER — Ambulatory Visit: Payer: BC Managed Care – PPO | Admitting: Internal Medicine

## 2022-10-09 ENCOUNTER — Encounter: Payer: Self-pay | Admitting: Gastroenterology

## 2022-10-09 NOTE — Telephone Encounter (Signed)
I have attempted to reach the pt several times on telephone and mychart. We keep playing phone tag with each other.  When pt calls back, please discuss what he is needing ad send to the pod for review.   Thanks.

## 2022-10-12 ENCOUNTER — Telehealth: Payer: Self-pay | Admitting: Gastroenterology

## 2022-10-12 NOTE — Telephone Encounter (Signed)
OK to waive late cancellation fee this time.

## 2022-10-12 NOTE — Telephone Encounter (Signed)
Patient called to cancel his procedure on Monday, is wondering if he could get his cancel fee waived due to him losing his job therefore losing his insurance which is why he can no longer have the procedure at the moment. Please advise

## 2022-10-16 ENCOUNTER — Encounter: Payer: BC Managed Care – PPO | Admitting: Gastroenterology

## 2022-10-18 ENCOUNTER — Ambulatory Visit: Payer: BC Managed Care – PPO | Admitting: Neurology

## 2022-10-24 ENCOUNTER — Encounter: Payer: Self-pay | Admitting: Internal Medicine

## 2022-10-25 ENCOUNTER — Encounter: Payer: Self-pay | Admitting: Internal Medicine

## 2022-10-25 NOTE — Progress Notes (Unsigned)
[facility] [address] [place].  [address] [phone]       STROKE FOLLOW UP NOTE  [name] Date of Birth:  Aug 21, 1961 Medical Record Number:  [id number]   Reason for Referral: stroke follow up    CHIEF COMPLAINT:  No chief complaint on file.   HPI:  Update 10/26/2022 JM: Patient returns for follow-up after prior visit over 1 year ago.        History provided for reference purposes only Update 06/28/2021 Dr. Leonie Man: He returns for follow-up after last visit more than a year ago.  He states he is doing well.  He has not had any definite stroke or TIA symptoms however he had an episode on 12/25/2020  when he was seen in the ER for sweatiness, feeling hot and lightheaded.  He had had a busy day and had not eaten well.  He also had some feeling of dizziness and right sided tingling.  He underwent extensive work-up including EKG, cardiac enzymes which were negative and an MRI scan of the brain which showed no acute abnormality.  He states his fine motor skills have improved and has been typing at his previous.  Now.  Still has some occasional heightened emotions and can cry easily while watching the side seen listening to sound.  This is not bothersome or interfering with his day-to-day activities.  His speech is back to baseline.  He is tolerating aspirin well without bruising or bleeding.  His blood pressure under good control today it is 127/83.  He has not had any recent follow-up lipid profile, carotid ultrasound check.  He has no new complaints.  Update 04/07/2020 JM: Jeffrey [name] returns for stroke follow-up.  He has been doing well since prior visit with great improvement of prior stroke deficits.  Reports occasional dysarthria typically when speaking too quickly and very slight decrease left hand dexterity but does not interfere with daily activities.  He also reports since his stroke, he has been experiencing heightened emotions such as  crying seeing a sad movie or listening to a song.  Did not experience this prior.  Denies symptoms of depression or anxiety.  Denies new stroke/TIA symptoms.  Continues on aspirin and atorvastatin without side effects.  Blood pressure today 119/82.  Has follow-up next month with PCP.  No further concerns.  Initial visit 12/02/2019 JM: Jeffrey [name] is a 62 year old male who is being seen today for hospital follow-up.  He has been recovering well from a stroke standpoint with residual mild left facial droop, mild dysarthria and decreased left hand dexterity but does report overall improvement.  He recently completed speech therapy but continues to do exercises at home.  He does report intermittent RUE "cold sensation" in forearm which he was experiencing initially upon presentation to ED.  He denies pain, weakness or numbness/tingling.  He has returned back to all prior activities without difficulty including working.  Completed 3 weeks DAPT and continues on aspirin alone without bleeding or bruising.  Continues on atorvastatin 40 mg daily without myalgias.  He does question if atorvastatin could be causing RUE symptoms and if he needs a change to a different statin.  Blood pressure today 122/88.  He reports ongoing smokeless tobacco use but has greatly decreased use with hopeful near future complete cessation.  Endorses complete cessation of cocaine and denies any other recreational drug use.  No concerns at this time.  Stroke admission 10/23/2019: Jeffrey [name] is a 62 y.o. male with no  significant past medical history presented on 10/23/2019 with L facial droop and dysarthria.  Evaluated by stroke team and Dr. Erlinda Hong with stroke work-up revealing right BG/PLIC small infarct secondary to small vessel disease source.  Recommended DAPT for 3 weeks and aspirin alone.  UDS positive for cocaine with patient admitting to intermittent cocaine use with cocaine cessation education provided.  LDL 99 initiate atorvastatin  40 mg daily.  No history of HTN or DM.  Other stroke risk factors include smokeless tobacco use and EtOH use but no prior history of stroke.  Residual deficits of left facial droop, mild dysarthria and left hand dysmetria and discharged home in stable condition with recommendation of outpatient therapy.  Stroke:   R BG/PLIC small infarct secondary to small vessel disease source Code Stroke CT head No acute abnormality. sinus dz. ASPECTS 10.    CTA head & neck normal MRI  R basal ganglia / PLIC infarct  2D Echo EF 60-65%. No source of embolus  LDL 99 HgbA1c 5.0 Lovenox 40 mg sq daily for VTE prophylaxis No antithrombotic prior to admission, now on aspirin 81 mg daily and clopidogrel 75 mg daily following load. Continue DAPT x 3 weeks then ASPIRIN alone   Therapy recommendations:  OP SLP, no PT Disposition:  Return home       ROS:   14 system review of systems performed and negative with exception of dysarthria, decreased right hand dexterity, and facial weakness  PMH:  Past Medical History:  Diagnosis Date   Acute CVA (cerebrovascular accident) ([place])    Allergy    Colonic polyp    adenomatous   Esophageal stricture    GERD (gastroesophageal reflux disease)    Hemorrhage, anal or rectal    Hiatal hernia    Hyperlipidemia    Reflux esophagitis     PSH:  Past Surgical History:  Procedure Laterality Date   COLONOSCOPY     LIPOMA EXCISION     polyectomy  2008 & 2014   POLYPECTOMY     UPPER GASTROINTESTINAL ENDOSCOPY      Social History:  Social History   Socioeconomic History   Marital status: Married    Spouse name: Not on file   Number of children: Not on file   Years of education: Not on file   Highest education level: Not on file  Occupational History   Not on file  Tobacco Use   Smoking status: Never   Smokeless tobacco: Current    Types: Snuff  Vaping Use   Vaping Use: Never used  Substance and Sexual Activity   Alcohol use: Yes    Comment:  occasionally   Drug use: No   Sexual activity: Not on file  Other Topics Concern   Not on file  Social History Narrative   Um[employer] softball a lot      No regular exercise   Social Determinants of Health   Financial Resource Strain: Not on file  Food Insecurity: Not on file  Transportation Needs: Not on file  Physical Activity: Not on file  Stress: Not on file  Social Connections: Not on file  Intimate Partner Violence: Not on file    Family History:  Family History  Problem Relation Age of Onset   Diabetes Mother    Heart disease Father    Diabetes Brother    Heart disease Brother    Colon cancer Neg Hx    Stomach cancer Neg Hx    Esophageal cancer Neg Hx  Rectal cancer Neg Hx    Liver cancer Neg Hx    Pancreatic cancer Neg Hx     Medications:   Current Outpatient Medications on File Prior to Visit  Medication Sig Dispense Refill   aspirin EC 81 MG EC tablet Take 1 tablet (81 mg total) by mouth daily.     atorvastatin (LIPITOR) 40 MG tablet TAKE 1 TABLET DAILY AT 6 P.M. 90 tablet 3   omeprazole (PRILOSEC) 20 MG capsule TAKE 1 CAPSULE DAILY 90 capsule 3   No current facility-administered medications on file prior to visit.    Allergies:  No Known Allergies   Physical Exam  There were no vitals filed for this visit.  There is no height or weight on file to calculate BMI. No results found.   General: well developed, well nourished, pleasant middle-age Caucasian male, seated, in no evident distress Head: head normocephalic and atraumatic.   Neck: supple with no carotid or supraclavicular bruits Cardiovascular: regular rate and rhythm, no murmurs Musculoskeletal: no deformity Skin:  no rash/petichiae Vascular:  Normal pulses all extremities   Neurologic Exam Mental Status: Awake and fully alert.   Occasional slight dysarthria with speaking to quickly.  Oriented to place and time. Recent and remote memory intact. Attention span, concentration and fund of  knowledge appropriate. Mood and affect appropriate.  Cranial Nerves: Pupils equal, briskly reactive to light. Extraocular movements full without nystagmus. Visual fields full to confrontation. Hearing intact. Facial sensation intact.  Mild left nasolabial fold flattening Motor: Normal bulk and tone. Normal strength in all tested extremity muscles  Sensory.: intact to touch , pinprick , position and vibratory sensation.  Coordination: Rapid alternating movements normal in all extremities except very slightly decreased left hand.  Finger-to-nose and heel-to-shin performed accurately bilaterally. Gait and Station: Arises from chair without difficulty. Stance is normal. Gait demonstrates normal stride length and balance Reflexes: 1+ and symmetric. Toes downgoing.       ASSESSMENT: Jeffrey [name] is a 62 y.o. year old male presented with left facial droop and dysarthria on 10/23/2019 with stroke work-up revealing right BG/PLIC stroke secondary to small vessel disease source. Vascular risk factors include HLD, smokeless tobacco use and cocaine use.  He is doing well with practically no residual deficits    PLAN:  Continue aspirin and atorvastatin for secondary stroke prevention measures  Continue close PCP follow-up for aggressive stroke risk factor management sitting HLD with LDL goal <70 Stroke labs 06/2021: LDL 54, A1c 5.5    I spent *** minutes of face-to-face and non-face-to-face time with patient.  This included previsit chart review, lab review, study review, order entry, electronic health record documentation, patient education  Frann Rider, [facility]  [facility] [address] [place], [place] [address]  Phone [phone] Fax [phone] Note: This document was prepared with digital dictation and possible smart phrase technology. Any transcriptional errors that result from this process are unintentional.

## 2022-10-25 NOTE — Progress Notes (Unsigned)
    Subjective:    Patient ID: Jeffrey [name], male    DOB: 02/14/1961, 62 y.o.   MRN: [id number]      HPI Rowyn is here for No chief complaint on file.     Patients with narcolepsy type 1 (narcolepsy with cataplexy) typically present with moderate to severe daytime sleepiness, transient facial weakness or falls triggered by joking or laughter (partial or complete cataplexy), or the inability to move for one or two minutes immediately after awakening or just before falling asleep   Cataplexy is emotionally-triggered transient muscle weakness Hypnagogic hallucinations are vivid, often frightening visual, tactile, or auditory hallucinations that occur as the patient is falling asleep Sleep paralysis is the complete inability to move for one or two minutes when awakening or when falling asleep.   Medications and allergies reviewed with patient and updated if appropriate.  Current Outpatient Medications on File Prior to Visit  Medication Sig Dispense Refill   aspirin EC 81 MG EC tablet Take 1 tablet (81 mg total) by mouth daily.     atorvastatin (LIPITOR) 40 MG tablet TAKE 1 TABLET DAILY AT 6 P.M. 90 tablet 3   omeprazole (PRILOSEC) 20 MG capsule TAKE 1 CAPSULE DAILY 90 capsule 3   No current facility-administered medications on file prior to visit.    Review of Systems     Objective:  There were no vitals filed for this visit. BP Readings from Last 3 Encounters:  06/28/21 127/83  01/19/21 116/74  12/25/20 134/90   Wt Readings from Last 3 Encounters:  09/25/22 168 lb (76.2 kg)  06/28/21 168 lb (76.2 kg)  01/19/21 164 lb (74.4 kg)   There is no height or weight on file to calculate BMI.    Physical Exam         Assessment & Plan:    See Problem List for Assessment and Plan of chronic medical problems.

## 2022-10-26 ENCOUNTER — Encounter: Payer: Self-pay | Admitting: Adult Health

## 2022-10-26 ENCOUNTER — Ambulatory Visit: Payer: BC Managed Care – PPO | Admitting: Adult Health

## 2022-10-26 ENCOUNTER — Ambulatory Visit: Payer: BC Managed Care – PPO | Admitting: Internal Medicine

## 2022-10-26 ENCOUNTER — Encounter: Payer: Self-pay | Admitting: Internal Medicine

## 2022-10-26 ENCOUNTER — Other Ambulatory Visit (INDEPENDENT_AMBULATORY_CARE_PROVIDER_SITE_OTHER): Payer: Self-pay

## 2022-10-26 ENCOUNTER — Other Ambulatory Visit: Payer: Self-pay

## 2022-10-26 ENCOUNTER — Telehealth: Payer: Self-pay

## 2022-10-26 VITALS — BP 124/78 | HR 75 | Temp 97.8°F | Ht 68.0 in | Wt 160.0 lb

## 2022-10-26 VITALS — BP 132/83 | HR 67 | Ht 68.0 in | Wt 160.8 lb

## 2022-10-26 DIAGNOSIS — Z8673 Personal history of transient ischemic attack (TIA), and cerebral infarction without residual deficits: Secondary | ICD-10-CM | POA: Diagnosis not present

## 2022-10-26 DIAGNOSIS — R5383 Other fatigue: Secondary | ICD-10-CM

## 2022-10-26 DIAGNOSIS — R0683 Snoring: Secondary | ICD-10-CM

## 2022-10-26 DIAGNOSIS — D649 Anemia, unspecified: Secondary | ICD-10-CM

## 2022-10-26 DIAGNOSIS — Z125 Encounter for screening for malignant neoplasm of prostate: Secondary | ICD-10-CM | POA: Diagnosis not present

## 2022-10-26 DIAGNOSIS — K219 Gastro-esophageal reflux disease without esophagitis: Secondary | ICD-10-CM

## 2022-10-26 DIAGNOSIS — N529 Male erectile dysfunction, unspecified: Secondary | ICD-10-CM | POA: Insufficient documentation

## 2022-10-26 LAB — CBC WITH DIFFERENTIAL/PLATELET
Basophils Absolute: 0.1 10*3/uL (ref 0.0–0.1)
Basophils Relative: 2.4 % (ref 0.0–3.0)
Eosinophils Absolute: 0.5 10*3/uL (ref 0.0–0.7)
Eosinophils Relative: 9.9 % — ABNORMAL HIGH (ref 0.0–5.0)
HCT: 19.7 % — CL (ref 39.0–52.0)
Hemoglobin: 5.9 g/dL — CL (ref 13.0–17.0)
Lymphocytes Relative: 32.8 % (ref 12.0–46.0)
Lymphs Abs: 1.7 10*3/uL (ref 0.7–4.0)
MCHC: 29.8 g/dL — ABNORMAL LOW (ref 30.0–36.0)
MCV: 63.7 fl — ABNORMAL LOW (ref 78.0–100.0)
Monocytes Absolute: 0.7 10*3/uL (ref 0.1–1.0)
Monocytes Relative: 13.9 % — ABNORMAL HIGH (ref 3.0–12.0)
Neutro Abs: 2.2 10*3/uL (ref 1.4–7.7)
Neutrophils Relative %: 41 % — ABNORMAL LOW (ref 43.0–77.0)
Platelets: 298 10*3/uL (ref 150.0–400.0)
RBC: 3.1 Mil/uL — ABNORMAL LOW (ref 4.22–5.81)
RDW: 21.1 % — ABNORMAL HIGH (ref 11.5–15.5)
WBC: 5.3 10*3/uL (ref 4.0–10.5)

## 2022-10-26 LAB — COMPREHENSIVE METABOLIC PANEL
ALT: 12 U/L (ref 0–53)
AST: 19 U/L (ref 0–37)
Albumin: 3.8 g/dL (ref 3.5–5.2)
Alkaline Phosphatase: 65 U/L (ref 39–117)
BUN: 16 mg/dL (ref 6–23)
CO2: 31 mEq/L (ref 19–32)
Calcium: 9 mg/dL (ref 8.4–10.5)
Chloride: 102 mEq/L (ref 96–112)
Creatinine, Ser: 1.08 mg/dL (ref 0.40–1.50)
GFR: 74.46 mL/min (ref >60.00)
Glucose, Bld: 64 mg/dL — ABNORMAL LOW (ref 70–99)
Potassium: 4.4 mEq/L (ref 3.5–5.1)
Sodium: 139 mEq/L (ref 135–145)
Total Bilirubin: 0.3 mg/dL (ref 0.2–1.2)
Total Protein: 7.2 g/dL (ref 6.0–8.3)

## 2022-10-26 LAB — LIPID PANEL
Cholesterol: 103 mg/dL (ref 0–200)
HDL: 41.6 mg/dL (ref >39.00)
LDL Cholesterol: 40 mg/dL (ref 0–99)
NonHDL: 61.8
Total CHOL/HDL Ratio: 2
Triglycerides: 109 mg/dL (ref 0.0–149.0)
VLDL: 21.8 mg/dL (ref 0.0–40.0)

## 2022-10-26 LAB — PSA: PSA: 0.33 ng/mL (ref 0.10–4.00)

## 2022-10-26 LAB — TSH: TSH: 1.59 u[IU]/mL (ref 0.35–5.50)

## 2022-10-26 MED ORDER — OMEPRAZOLE 20 MG PO CPDR: 20.0000 mg | DELAYED_RELEASE_CAPSULE | Freq: Every day | ORAL | 3 refills | Status: DC

## 2022-10-26 MED ORDER — ATORVASTATIN CALCIUM 40 MG PO TABS: ORAL_TABLET | ORAL | 3 refills | Status: DC

## 2022-10-26 MED ORDER — TADALAFIL 20 MG PO TABS: 10.0000 mg | ORAL_TABLET | ORAL | 11 refills | Status: DC | PRN

## 2022-10-26 NOTE — Assessment & Plan Note (Signed)
Chronic Would like to have medication - has tried viagra and cialis in the past Cialis 10-20 mg daily prn

## 2022-10-26 NOTE — Telephone Encounter (Signed)
Called and left messages on both numbers in chart to return call to clinic.  Also sent detailed my-chart messages with Dr. Quay Burow recommendations.

## 2022-10-26 NOTE — Assessment & Plan Note (Signed)
He snores and has daytime sleepiness - ? OSA  -- will refer for evaluation

## 2022-10-26 NOTE — Assessment & Plan Note (Addendum)
New States fatigue - daytime sleepiness He snores - ? OSA - will refer for eval for OSA Not getting enough sleep and sleep in interrupted - nocturia 3-4/night and is going a lot when he goes Advised getting to bed earlier Stressed decreased liquid intake after 6 pm - stop drinking coke at night - water only If above does not dec nocturia will evaluate possible BPH more CBC, CMP, TSH

## 2022-10-26 NOTE — Patient Instructions (Addendum)
      Blood work was ordered.   The lab is on the first floor.    Medications changes include :   none    A referral was ordered for [facility] for evaluation of sleep apnea.     Someone will call you to schedule an appointment.    Return in about 1 year (around 10/27/2023) for Physical Exam.

## 2022-10-26 NOTE — Assessment & Plan Note (Signed)
Chronic GERD controlled Continue omeprazole 20 mg daily  

## 2022-10-26 NOTE — Patient Instructions (Addendum)
Okay to proceed with surgical procedure with small but acceptable risk of recurrent stroke while off therapy, restart once advised by your surgeon   Continue aspirin and atorvastatin for secondary stroke prevention measures  Continue to follow with Dr. Quay Burow for aggressive stroke risk factor management    Follow up as needed at this time as you have been stable from a stroke standpoint

## 2022-10-26 NOTE — Telephone Encounter (Signed)
Call patient let him know he is severely anemic.  This is likely causing much of his fatigue.  He needs to hold the aspirin for right now.  Continue his other medications.      Since he is stable and not severely symptomatic we can try to evaluate this as an outpatient - I have ordered a stat referral to GI - will need a colonoscopy and endoscopy.  If he becomes more short of breath, lightheaded/dizziness, chest pain  he needs to go to the ED.     The rest of his labs are pending.

## 2022-10-26 NOTE — Assessment & Plan Note (Signed)
H/o CVA Following with neuro Continue ASA 81 mg daily, atorvastatin 40 mg daily BP well controlled Check sugars Cbc, cmp, lipids Stressed regular exercise Compliant with low sodium diet

## 2022-10-27 ENCOUNTER — Telehealth: Payer: Self-pay | Admitting: Internal Medicine

## 2022-10-27 ENCOUNTER — Telehealth: Payer: Self-pay | Admitting: Neurology

## 2022-10-27 ENCOUNTER — Other Ambulatory Visit: Payer: Self-pay | Admitting: Internal Medicine

## 2022-10-27 ENCOUNTER — Telehealth: Payer: Self-pay | Admitting: *Deleted

## 2022-10-27 DIAGNOSIS — D649 Anemia, unspecified: Secondary | ICD-10-CM

## 2022-10-27 LAB — CBC WITH DIFFERENTIAL/PLATELET
Basophils Absolute: 0.1 10*3/uL (ref 0.0–0.1)
Basophils Relative: 1.8 % (ref 0.0–3.0)
Eosinophils Absolute: 0.4 10*3/uL (ref 0.0–0.7)
Eosinophils Relative: 6.1 % — ABNORMAL HIGH (ref 0.0–5.0)
HCT: 21 % — CL (ref 39.0–52.0)
Hemoglobin: 6.2 g/dL — CL (ref 13.0–17.0)
Lymphocytes Relative: 15.7 % (ref 12.0–46.0)
Lymphs Abs: 1 10*3/uL (ref 0.7–4.0)
MCHC: 29.8 g/dL — ABNORMAL LOW (ref 30.0–36.0)
MCV: 64.2 fl — ABNORMAL LOW (ref 78.0–100.0)
Monocytes Absolute: 0.5 10*3/uL (ref 0.1–1.0)
Monocytes Relative: 8.5 % (ref 3.0–12.0)
Neutro Abs: 4.2 10*3/uL (ref 1.4–7.7)
Neutrophils Relative %: 67.9 % (ref 43.0–77.0)
Platelets: 342 10*3/uL (ref 150.0–400.0)
RBC: 3.27 Mil/uL — ABNORMAL LOW (ref 4.22–5.81)
RDW: 21.2 % — ABNORMAL HIGH (ref 11.5–15.5)
WBC: 6.2 10*3/uL (ref 4.0–10.5)

## 2022-10-27 LAB — IBC + FERRITIN
Ferritin: 4.5 ng/mL — ABNORMAL LOW (ref 22.0–322.0)
Iron: 16 ug/dL — ABNORMAL LOW (ref 42–165)
Saturation Ratios: 2.9 % — ABNORMAL LOW (ref 20.0–50.0)
TIBC: 555.8 ug/dL — ABNORMAL HIGH (ref 250.0–450.0)
Transferrin: 397 mg/dL — ABNORMAL HIGH (ref 212.0–360.0)

## 2022-10-27 NOTE — Telephone Encounter (Signed)
Called Pt and left voicemail for patient to give the office a call back.

## 2022-10-27 NOTE — Telephone Encounter (Signed)
Pt called wanting to go ahead and come in today and do blood work, and go to the ED tomorrow.

## 2022-10-27 NOTE — Telephone Encounter (Signed)
Last read by Dione Plover "Bill" at  3:41 AM on 10/27/2022.

## 2022-10-27 NOTE — Telephone Encounter (Signed)
Critical Hemoglobin @ 6.2 and Hematocrit @ 21.7

## 2022-10-27 NOTE — Telephone Encounter (Signed)
Please call him.  The remainder of his blood work looked good.  Everything was normal.  We did add on iron counts to his blood work from yesterday and that is still pending.    I did order an urgent referral to GI because I am concerned about needing someplace in his GI system-this could be very slow which is why he is less symptomatic benefit was quicker.  He is very anemic and would benefit from a blood transfusion.  I am not sure how quickly GI can get him in, but if they cannot get him in quickly or his symptoms increase then we need to consider having him go to the emergency room where they would likely give him a blood transfusion, keep him overnight and likely do a colonoscopy and endoscopy to rule out active bleeding.  The quickest way to get this done would be to have him go to [facility] emergency room where the emergency room his last visit.  Most likely it would only require 1 night stay.  EP really does not want to do that then I would like him to come back to have repeat blood work to make sure it is not getting worse.  The best option for him is probably to go to the emergency room and get the blood transfusion

## 2022-10-27 NOTE — Telephone Encounter (Signed)
Spoke with patient and reiterated for him to go to the ED.  He states he will go early in the morning.

## 2022-10-27 NOTE — Telephone Encounter (Signed)
Pt called back and I stated all the information that Dr.Burns has given and told him it was best for him to head to ER, Pt agreed to go ahead to ER and he will set up a appt with GI as well.

## 2022-10-27 NOTE — Telephone Encounter (Signed)
Blood counts stable, but still low

## 2022-10-27 NOTE — Telephone Encounter (Signed)
Sent Pt message Via Mychart stating the information, Dr.Burns has given and told him to confirm once message is received.

## 2022-10-27 NOTE — Telephone Encounter (Signed)
Jeffrey [name] asked about D/c aspirin , he was tested today for CBC and was very low on RBC, severe anemia - he was called and advised by PCP asked to go to ED and stop ASA. I asked him to go too. Stop ASA, see ED, check GI.

## 2022-10-28 ENCOUNTER — Encounter (HOSPITAL_COMMUNITY): Payer: Self-pay

## 2022-10-28 ENCOUNTER — Other Ambulatory Visit: Payer: Self-pay

## 2022-10-28 ENCOUNTER — Emergency Department (HOSPITAL_COMMUNITY)
Admission: EM | Admit: 2022-10-28 | Discharge: 2022-10-29 | Disposition: A | Discharge status: Home / Self Care | Payer: BC Managed Care – PPO | Attending: Emergency Medicine | Admitting: Emergency Medicine

## 2022-10-28 ENCOUNTER — Emergency Department (HOSPITAL_COMMUNITY): Payer: BC Managed Care – PPO

## 2022-10-28 DIAGNOSIS — Z7982 Long term (current) use of aspirin: Secondary | ICD-10-CM | POA: Diagnosis not present

## 2022-10-28 DIAGNOSIS — D594 Other nonautoimmune hemolytic anemias: Secondary | ICD-10-CM | POA: Insufficient documentation

## 2022-10-28 DIAGNOSIS — R0602 Shortness of breath: Secondary | ICD-10-CM | POA: Diagnosis not present

## 2022-10-28 DIAGNOSIS — D649 Anemia, unspecified: Secondary | ICD-10-CM | POA: Diagnosis not present

## 2022-10-28 DIAGNOSIS — Z20822 Contact with and (suspected) exposure to covid-19: Secondary | ICD-10-CM | POA: Insufficient documentation

## 2022-10-28 DIAGNOSIS — D509 Iron deficiency anemia, unspecified: Secondary | ICD-10-CM | POA: Diagnosis not present

## 2022-10-28 LAB — CBC WITH DIFFERENTIAL/PLATELET
Abs Immature Granulocytes: 0.02 10*3/uL (ref 0.00–0.07)
Basophils Absolute: 0.1 10*3/uL (ref 0.0–0.1)
Basophils Relative: 2 %
Eosinophils Absolute: 0.4 10*3/uL (ref 0.0–0.5)
Eosinophils Relative: 6 %
HCT: 21.4 % — ABNORMAL LOW (ref 39.0–52.0)
Hemoglobin: 5.7 g/dL — CL (ref 13.0–17.0)
Immature Granulocytes: 0 %
Lymphocytes Relative: 20 %
Lymphs Abs: 1.2 10*3/uL (ref 0.7–4.0)
MCH: 18.3 pg — ABNORMAL LOW (ref 26.0–34.0)
MCHC: 26.6 g/dL — ABNORMAL LOW (ref 30.0–36.0)
MCV: 68.8 fL — ABNORMAL LOW (ref 80.0–100.0)
Monocytes Absolute: 0.5 10*3/uL (ref 0.1–1.0)
Monocytes Relative: 8 %
Neutro Abs: 3.7 10*3/uL (ref 1.7–7.7)
Neutrophils Relative %: 64 %
Platelets: 329 10*3/uL (ref 150–400)
RBC: 3.11 MIL/uL — ABNORMAL LOW (ref 4.22–5.81)
RDW: 20.7 % — ABNORMAL HIGH (ref 11.5–15.5)
WBC: 5.8 10*3/uL (ref 4.0–10.5)
nRBC: 0 % (ref 0.0–0.2)

## 2022-10-28 LAB — COMPREHENSIVE METABOLIC PANEL
ALT: 14 U/L (ref 0–44)
AST: 24 U/L (ref 15–41)
Albumin: 3.5 g/dL (ref 3.5–5.0)
Alkaline Phosphatase: 59 U/L (ref 38–126)
Anion gap: 9 (ref 5–15)
BUN: 13 mg/dL (ref 6–20)
CO2: 24 mmol/L (ref 22–32)
Calcium: 8.8 mg/dL — ABNORMAL LOW (ref 8.9–10.3)
Chloride: 103 mmol/L (ref 98–111)
Creatinine, Ser: 0.97 mg/dL (ref 0.61–1.24)
GFR, Estimated: >60 mL/min (ref >60)
Glucose, Bld: 80 mg/dL (ref 70–99)
Potassium: 3.9 mmol/L (ref 3.5–5.1)
Sodium: 136 mmol/L (ref 135–145)
Total Bilirubin: 0.2 mg/dL — ABNORMAL LOW (ref 0.3–1.2)
Total Protein: 7.4 g/dL (ref 6.5–8.1)

## 2022-10-28 LAB — RESP PANEL BY RT-PCR (RSV, FLU A&B, COVID)  RVPGX2
Influenza A by PCR: NEGATIVE
Influenza B by PCR: NEGATIVE
Resp Syncytial Virus by PCR: NEGATIVE
SARS Coronavirus 2 by RT PCR: NEGATIVE

## 2022-10-28 LAB — IRON AND TIBC
Iron: 11 ug/dL — ABNORMAL LOW (ref 45–182)
Saturation Ratios: 2 % — ABNORMAL LOW (ref 17.9–39.5)
TIBC: 521 ug/dL — ABNORMAL HIGH (ref 250–450)
UIBC: 510 ug/dL

## 2022-10-28 LAB — URINALYSIS, ROUTINE W REFLEX MICROSCOPIC
Bacteria, UA: NONE SEEN
Bilirubin Urine: NEGATIVE
Glucose, UA: NEGATIVE mg/dL
Hgb urine dipstick: NEGATIVE
Ketones, ur: NEGATIVE mg/dL
Leukocytes,Ua: NEGATIVE
Nitrite: NEGATIVE
Protein, ur: NEGATIVE mg/dL
Specific Gravity, Urine: 1.002 — ABNORMAL LOW (ref 1.005–1.030)
pH: 6 (ref 5.0–8.0)

## 2022-10-28 LAB — PREPARE RBC (CROSSMATCH)

## 2022-10-28 LAB — POC OCCULT BLOOD, ED: Fecal Occult Bld: NEGATIVE

## 2022-10-28 LAB — FERRITIN: Ferritin: 3 ng/mL — ABNORMAL LOW (ref 24–336)

## 2022-10-28 LAB — ABO/RH: ABO/RH(D): A NEG

## 2022-10-28 MED ORDER — SODIUM CHLORIDE 0.9% IV SOLUTION: Freq: Once | INTRAVENOUS | Status: AC

## 2022-10-28 MED ORDER — FERROUS SULFATE 325 (65 FE) MG PO TABS: 325.0000 mg | ORAL_TABLET | Freq: Every day | ORAL | 0 refills | Status: AC

## 2022-10-28 NOTE — Discharge Instructions (Addendum)
You received 2 units of blood and transfusion today due to a hemoglobin of 5.7 likely due to iron deficiency anemia.  Recommend starting iron supplementation and following up with your PCP.

## 2022-10-28 NOTE — ED Provider Triage Note (Signed)
Emergency Medicine Provider Triage Evaluation Note  Jeffrey [name] , a 62 y.o. male  was evaluated in triage.  Pt complains of anemia and shortness of breath.  Patient reports that he was advised by his primary care provider to come into the emergency department due to significant anemia noted on the CBC.  Patient denies any obvious sources of bleeding such as hematemesis, melena, hematochezia.  Patient does report that he previous has had hemorrhoids but does not report any recent hemorrhoids.  Patient denies any smoking tobacco use and only mild alcohol use with a reported 1 episode of drinking per month consisting of 4-5 beers.  Patient also denies any significant ibuprofen or Aleve use.  Patient was taking baby aspirin daily for stroke prevention.  Review of Systems  Positive: As above Negative: As above  Physical Exam  BP 136/85 (BP Location: Left Arm)   Pulse 76   Temp (!) 97.5 F (36.4 C) (Oral)   Resp 13   SpO2 100%  Gen:   Awake, no distress   Resp:  Normal effort  MSK:   Moves extremities without difficulty  Other:  No TTP in abdomen  Medical Decision Making  Medically screening exam initiated at 1:00 PM.  Appropriate orders placed.  Jeffrey [name] was informed that the remainder of the evaluation will be completed by another provider, this initial triage assessment does not replace that evaluation, and the importance of remaining in the ED until their evaluation is complete.     Luvenia Heller, PA-C 10/28/22 1302

## 2022-10-28 NOTE — ED Triage Notes (Signed)
Pt arrived via POV, states was seen in office and told to come to ED due to low hemoglobin. Denies any known bleeding source.

## 2022-10-28 NOTE — ED Provider Notes (Signed)
[facility] Provider Note   CSN: [id number] Arrival date & time: 10/28/22  1229     History  Chief Complaint  Patient presents with   Abnormal Lab   Shortness of Breath    Jeffrey [name] is a 62 y.o. male.  HPI   62 year old male presenting to the emergency department with a chief complaint of low hemoglobin with associated symptoms.  The patient states that he has been feeling especially fatigued for the past few weeks.  He denies any melena, hematemesis, hematochezia.  He denies any history of anemia or need for blood transfusion.  He endorses mild shortness of breath and lightheadedness and generalized fatigue which has been progressively bothering him.  He denies any syncope.  He denies any chest pain.  He denies any fever or chills.  PCP advised him to present to the emergency department because his hemoglobin was found to be low at 5.92 days ago and 6.2 yesterday.  Home Medications Prior to Admission medications   Medication Sig Start Date End Date Taking? Authorizing Provider  ferrous sulfate 325 (65 FE) MG tablet Take 1 tablet (325 mg total) by mouth daily. 10/28/22  Yes Regan Lemming, MD  aspirin EC 81 MG EC tablet Take 1 tablet (81 mg total) by mouth daily. 10/25/19   Radene Gunning, NP  atorvastatin (LIPITOR) 40 MG tablet TAKE 1 TABLET DAILY AT 6 P.M. 10/26/22   Binnie Rail, MD  omeprazole (PRILOSEC) 20 MG capsule Take 1 capsule (20 mg total) by mouth daily. 10/26/22   Binnie Rail, MD  tadalafil (CIALIS) 20 MG tablet Take 0.5-1 tablets (10-20 mg total) by mouth every other day as needed for erectile dysfunction. 10/26/22   Binnie Rail, MD      Allergies    Patient has no known allergies.    Review of Systems   Review of Systems  All other systems reviewed and are negative.   Physical Exam Updated Vital Signs BP (!) 154/92   Pulse (!) 59   Temp 98 F (36.7 C) (Oral)   Resp 15   Ht 5' 8"$  (1.727 m)   Wt 72.6  kg   SpO2 99%   BMI 24.33 kg/m  Physical Exam Vitals and nursing note reviewed.  Constitutional:      General: He is not in acute distress.    Appearance: He is well-developed.  HENT:     Head: Normocephalic and atraumatic.  Eyes:     Conjunctiva/sclera: Conjunctivae normal.  Cardiovascular:     Rate and Rhythm: Normal rate and regular rhythm.     Heart sounds: No murmur heard. Pulmonary:     Effort: Pulmonary effort is normal. No respiratory distress.     Breath sounds: Normal breath sounds.  Abdominal:     Palpations: Abdomen is soft.     Tenderness: There is no abdominal tenderness.  Musculoskeletal:        General: No swelling.     Cervical back: Neck supple.  Skin:    General: Skin is warm and dry.     Capillary Refill: Capillary refill takes less than 2 seconds.     Coloration: Skin is pale.  Neurological:     Mental Status: He is alert.  Psychiatric:        Mood and Affect: Mood normal.     ED Results / Procedures / Treatments   Labs (all labs ordered are listed, but only abnormal results are displayed)  Labs Reviewed  CBC WITH DIFFERENTIAL/PLATELET - Abnormal; Notable for the following components:      Result Value   RBC 3.11 (*)    Hemoglobin 5.7 (*)    HCT 21.4 (*)    MCV 68.8 (*)    MCH 18.3 (*)    MCHC 26.6 (*)    RDW 20.7 (*)    All other components within normal limits  COMPREHENSIVE METABOLIC PANEL - Abnormal; Notable for the following components:   Calcium 8.8 (*)    Total Bilirubin 0.2 (*)    All other components within normal limits  URINALYSIS, ROUTINE W REFLEX MICROSCOPIC - Abnormal; Notable for the following components:   Color, Urine COLORLESS (*)    Specific Gravity, Urine 1.002 (*)    All other components within normal limits  IRON AND TIBC - Abnormal; Notable for the following components:   Iron 11 (*)    TIBC 521 (*)    Saturation Ratios 2 (*)    All other components within normal limits  FERRITIN - Abnormal; Notable for the  following components:   Ferritin 3 (*)    All other components within normal limits  RESP PANEL BY RT-PCR (RSV, FLU A&B, COVID)  RVPGX2  POC OCCULT BLOOD, ED  ABO/RH  TYPE AND SCREEN  PREPARE RBC (CROSSMATCH)    EKG EKG Interpretation  Date/Time:  Saturday October 28 2022 12:39:02 EST Ventricular Rate:  74 PR Interval:  139 QRS Duration: 110 QT Interval:  396 QTC Calculation: 440 R Axis:   80 Text Interpretation: Sinus rhythm Probable left ventricular hypertrophy Confirmed by Regan Lemming ([id number]) on 10/28/2022 7:46:52 PM  Radiology DG Chest 2 View  Result Date: 10/28/2022 CLINICAL DATA:  Shortness of breath and anemia. EXAM: CHEST - 2 VIEW COMPARISON:  Chest radiograph 08/25/2011 FINDINGS: The heart size and mediastinal contours are within normal limits. Both lungs are clear. No pleural effusion or pneumothorax. The visualized skeletal structures are unremarkable. IMPRESSION: No acute cardiopulmonary abnormality. Electronically Signed   By: Ileana Roup M.D.   On: 10/28/2022 13:52    Procedures .Critical Care  Performed by: Regan Lemming, MD Authorized by: Regan Lemming, MD   Critical care provider statement:    Critical care time (minutes):  30   Critical care was time spent personally by me on the following activities:  Development of treatment plan with patient or surrogate, discussions with consultants, evaluation of patient's response to treatment, examination of patient, ordering and review of laboratory studies, ordering and review of radiographic studies, ordering and performing treatments and interventions, pulse oximetry, re-evaluation of patient's condition and review of old charts     Medications Ordered in ED Medications  0.9 %  sodium chloride infusion (Manually program via Guardrails IV Fluids) ( Intravenous New Bag/Given 10/28/22 1607)    ED Course/ Medical Decision Making/ A&P Clinical Course as of 10/28/22 2247  Sat Oct 28, 2022  1524 Hemoglobin(!!):  5.7 [JL]  1524 MCV(!): 68.8 [JL]  1706 Ferritin(!): 3 [JL]    Clinical Course User Index [JL] Regan Lemming, MD                             Medical Decision Making Amount and/or Complexity of Data Reviewed Labs: ordered. Decision-making details documented in ED Course.  Risk OTC drugs. Prescription drug management.    62 year old male presenting to the emergency department with a chief complaint of low hemoglobin with associated symptoms.  The patient  states that he has been feeling especially fatigued for the past few weeks.  He denies any melena, hematemesis, hematochezia.  He denies any history of anemia or need for blood transfusion.  He endorses mild shortness of breath and lightheadedness and generalized fatigue which has been progressively bothering him.  He denies any syncope.  He denies any chest pain.  He denies any fever or chills.  PCP advised him to present to the emergency department because his hemoglobin was found to be low at 5.9 2 days ago and 6.2 yesterday.  On arrival, the patient was vitally stable.  Presenting with signs of symptomatic anemia.  Hemoglobin low outpatient requiring likely blood transfusion.  CBC rechecked today and was found have a microcytic anemia with an MCV of 68.8 and hemoglobin of 5.7.  Suspect likely iron deficiency anemia.  Iron panel was ordered and this was confirmed with a ferritin of 3.  Patient was consented for blood transfusion and 2 units PRBCs were ordered and subsequently transfused.  Will start the patient on iron supplementation outpatient and have him follow-up outpatient with his PCP.  No transfusion reaction occurred while under observation in the emergency department. The patient had a negative occult blood with low concern for active GI bleed at this time.  He is not on anticoagulation.   Remainder of laboratory and imaging workup was reviewed and was reassuring.   Stable for discharge with outpatient management.   Final Clinical  Impression(s) / ED Diagnoses Final diagnoses:  Iron deficiency anemia, unspecified iron deficiency anemia type  Symptomatic anemia    Rx / DC Orders ED Discharge Orders          Ordered    ferrous sulfate 325 (65 FE) MG tablet  Daily        10/28/22 2017              Regan Lemming, MD 10/28/22 2247

## 2022-10-29 ENCOUNTER — Encounter: Payer: Self-pay | Admitting: Internal Medicine

## 2022-10-30 LAB — BPAM RBC
Blood Product Expiration Date: 202403092359
Blood Product Expiration Date: 202403092359
ISSUE DATE / TIME: 202402171729
ISSUE DATE / TIME: 202402172124
Unit Type and Rh: 600
Unit Type and Rh: 600

## 2022-10-30 LAB — TYPE AND SCREEN
ABO/RH(D): A NEG
Antibody Screen: NEGATIVE
Unit division: 0
Unit division: 0

## 2022-10-31 MED ORDER — TADALAFIL 20 MG PO TABS: 10.0000 mg | ORAL_TABLET | ORAL | 5 refills | Status: DC | PRN

## 2022-11-03 ENCOUNTER — Telehealth: Payer: Self-pay | Admitting: Gastroenterology

## 2022-11-03 NOTE — Telephone Encounter (Signed)
Called patient and left detailed message to call back to schedule pre visit.

## 2022-11-03 NOTE — Telephone Encounter (Signed)
Patient rescheduled for procedure. Please issue updated prep instructions.  Thank you

## 2022-11-08 ENCOUNTER — Telehealth: Payer: Self-pay

## 2022-11-08 NOTE — Telephone Encounter (Signed)
Patient was referred for Anemia and needs Office Visit instead of Previsit.  Called and left a message for patient to call back.

## 2022-11-09 ENCOUNTER — Telehealth: Payer: Self-pay

## 2022-11-09 ENCOUNTER — Encounter: Payer: Self-pay | Admitting: Internal Medicine

## 2022-11-09 DIAGNOSIS — D509 Iron deficiency anemia, unspecified: Secondary | ICD-10-CM | POA: Insufficient documentation

## 2022-11-09 NOTE — Progress Notes (Signed)
Subjective:    Patient ID: Jeffrey [name], male    DOB: 1961-01-08, 62 y.o.   MRN: [id number]     HPI Jeffrey [name] is here for follow up from the hospital / anemia.   Iron def anemia  - seen with blood work evaluating for fatigue, SOB, lightheadeness.   Sent to ED for possible transfusion - had 2 units PRBCs.  Guaiac negative and was hemodynamically stable- to see GI as outpatient 11/28/22.  Taking iron daily.   He feels better - less  fatigued with exertion, less SOB.  He does feel back to normal.    Medications and allergies reviewed with patient and updated if appropriate.  Current Outpatient Medications on File Prior to Visit  Medication Sig Dispense Refill   aspirin EC 81 MG EC tablet Take 1 tablet (81 mg total) by mouth daily.     atorvastatin (LIPITOR) 40 MG tablet TAKE 1 TABLET DAILY AT 6 P.M. 90 tablet 3   ferrous sulfate 325 (65 FE) MG tablet Take 1 tablet (325 mg total) by mouth daily. 30 tablet 0   omeprazole (PRILOSEC) 20 MG capsule Take 1 capsule (20 mg total) by mouth daily. 90 capsule 3   tadalafil (CIALIS) 20 MG tablet Take 0.5-1 tablets (10-20 mg total) by mouth every other day as needed for erectile dysfunction. 20 tablet 5   No current facility-administered medications on file prior to visit.     Review of Systems  Constitutional:  Negative for fatigue and fever.  Gastrointestinal:  Negative for abdominal pain, blood in stool (no black stool), constipation, nausea and vomiting.       No gerd  Genitourinary:  Negative for hematuria.  Neurological:  Negative for light-headedness and headaches.       Objective:   Vitals:   11/10/22 1021  BP: 126/80  Pulse: 70  Temp: 98 F (36.7 C)  SpO2: 98%   BP Readings from Last 3 Encounters:  11/10/22 126/80  10/29/22 138/81  10/26/22 124/78   Wt Readings from Last 3 Encounters:  11/10/22 158 lb (71.7 kg)  10/28/22 160 lb (72.6 kg)  10/26/22 160 lb (72.6 kg)   Body mass index is 24.02 kg/m.     Physical Exam Constitutional:      General: He is not in acute distress.    Appearance: Normal appearance. He is not ill-appearing.  HENT:     Head: Normocephalic and atraumatic.  Eyes:     Conjunctiva/sclera: Conjunctivae normal.  Cardiovascular:     Rate and Rhythm: Normal rate and regular rhythm.     Heart sounds: Normal heart sounds.  Pulmonary:     Effort: Pulmonary effort is normal. No respiratory distress.     Breath sounds: Normal breath sounds. No wheezing or rales.  Abdominal:     General: There is no distension.     Palpations: Abdomen is soft.     Tenderness: There is no abdominal tenderness. There is no guarding or rebound.  Musculoskeletal:     Right lower leg: No edema.     Left lower leg: No edema.  Skin:    General: Skin is warm and dry.     Findings: No rash.  Neurological:     Mental Status: He is alert. Mental status is at baseline.  Psychiatric:        Mood and Affect: Mood normal.        Lab Results  Component Value Date   WBC 5.8 10/28/2022  HGB 5.7 (LL) 10/28/2022   HCT 21.4 (L) 10/28/2022   PLT 329 10/28/2022   GLUCOSE 80 10/28/2022   CHOL 103 10/26/2022   TRIG 109.0 10/26/2022   HDL 41.60 10/26/2022   LDLDIRECT 116.0 07/03/2016   LDLCALC 40 10/26/2022   ALT 14 10/28/2022   AST 24 10/28/2022   NA 136 10/28/2022   K 3.9 10/28/2022   CL 103 10/28/2022   CREATININE 0.97 10/28/2022   BUN 13 10/28/2022   CO2 24 10/28/2022   TSH 1.59 10/26/2022   PSA 0.33 10/26/2022   INR 1.0 10/23/2019   HGBA1C 5.5 06/28/2021     Assessment & Plan:    See Problem List for Assessment and Plan of chronic medical problems.

## 2022-11-09 NOTE — Patient Instructions (Addendum)
      Blood work was ordered.   The lab is on the first floor.    Medications changes include :   none

## 2022-11-09 NOTE — Telephone Encounter (Signed)
He needs an office visit prior to colonoscopy with me or an APP.

## 2022-11-09 NOTE — Telephone Encounter (Signed)
Spoke with patient about HOCP and Anemia.  He was seen in ER for anemia on 10/28/22.   Patient was consented for blood transfusion and 2 units PRBCs were ordered and subsequently transfused. The patient  was started on iron supplementation outpatient.  No transfusion reaction occurred while under observation in the emergency department. The patient had a negative occult blood with low concern for active GI bleed at this time.  He is not on anticoagulation. Remainder of laboratory and imaging workup was reviewed and was reassuring.   Stable for discharge with outpatient management. His Pre visit is scheduled for tomorrow 11/10/22.  Please advise if he needs an office visit with you or keep the pre visit appointment on 11/10/22 and the colonoscopy that is scheduled for 11/28/22.

## 2022-11-10 ENCOUNTER — Encounter: Payer: Self-pay | Admitting: Internal Medicine

## 2022-11-10 ENCOUNTER — Ambulatory Visit: Payer: BC Managed Care – PPO | Admitting: Internal Medicine

## 2022-11-10 ENCOUNTER — Encounter: Payer: Self-pay | Admitting: Physician Assistant

## 2022-11-10 VITALS — BP 126/80 | HR 70 | Temp 98.0°F | Ht 68.0 in | Wt 158.0 lb

## 2022-11-10 DIAGNOSIS — Z8673 Personal history of transient ischemic attack (TIA), and cerebral infarction without residual deficits: Secondary | ICD-10-CM

## 2022-11-10 DIAGNOSIS — K219 Gastro-esophageal reflux disease without esophagitis: Secondary | ICD-10-CM

## 2022-11-10 DIAGNOSIS — D509 Iron deficiency anemia, unspecified: Secondary | ICD-10-CM | POA: Diagnosis not present

## 2022-11-10 LAB — CBC WITH DIFFERENTIAL/PLATELET
Basophils Absolute: 0.1 10*3/uL (ref 0.0–0.1)
Basophils Relative: 1.4 % (ref 0.0–3.0)
Eosinophils Absolute: 0.5 10*3/uL (ref 0.0–0.7)
Eosinophils Relative: 7.9 % — ABNORMAL HIGH (ref 0.0–5.0)
HCT: 31.2 % — ABNORMAL LOW (ref 39.0–52.0)
Hemoglobin: 9.7 g/dL — ABNORMAL LOW (ref 13.0–17.0)
Lymphocytes Relative: 27.2 % (ref 12.0–46.0)
Lymphs Abs: 1.7 10*3/uL (ref 0.7–4.0)
MCHC: 31.2 g/dL (ref 30.0–36.0)
MCV: 71.4 fl — ABNORMAL LOW (ref 78.0–100.0)
Monocytes Absolute: 0.6 10*3/uL (ref 0.1–1.0)
Monocytes Relative: 9.9 % (ref 3.0–12.0)
Neutro Abs: 3.4 10*3/uL (ref 1.4–7.7)
Neutrophils Relative %: 53.6 % (ref 43.0–77.0)
Platelets: 346 10*3/uL (ref 150.0–400.0)
RBC: 4.36 Mil/uL (ref 4.22–5.81)
RDW: 28.4 % — ABNORMAL HIGH (ref 11.5–15.5)
WBC: 6.4 10*3/uL (ref 4.0–10.5)

## 2022-11-10 LAB — IBC PANEL
Iron: 454 ug/dL — ABNORMAL HIGH (ref 42–165)
Saturation Ratios: 92.4 % — ABNORMAL HIGH (ref 20.0–50.0)
TIBC: 491.4 ug/dL — ABNORMAL HIGH (ref 250.0–450.0)
Transferrin: 351 mg/dL (ref 212.0–360.0)

## 2022-11-10 LAB — FERRITIN: Ferritin: 11.3 ng/mL — ABNORMAL LOW (ref 22.0–322.0)

## 2022-11-10 NOTE — Telephone Encounter (Signed)
Called patient back on home phone and left another message and also on mobile phone and left message to call before he comes to the Previsit, that he needs to schedule and office visit per Dr. Fuller Plan

## 2022-11-10 NOTE — Assessment & Plan Note (Addendum)
Subacute Hemoccult negative in the ED-?  GI related or not-I still have suspicion for possible GI bleed Has appointment to see GI this month for colonoscopy-speaks to nurse today-he does need an endoscopy as well, which he will discuss with GI today and let me know if I need to do anything Taking iron pills daily-continue Check CBC, iron panel Referral to heme-onc depending on GI results Continue aspirin for now given history of stroke

## 2022-11-10 NOTE — Telephone Encounter (Signed)
Office visit made for 12/13/22 with Dr. Fuller Plan

## 2022-11-10 NOTE — Telephone Encounter (Signed)
Called patient to let him know that Dr. Fuller Plan wants an OV after his ER visit on 2/17.  Left message to call back and schedule the OV.

## 2022-11-10 NOTE — Assessment & Plan Note (Signed)
Chronic GERD controlled Continue omeprazole 20 mg daily  

## 2022-11-10 NOTE — Assessment & Plan Note (Signed)
History of CVA Briefly took him off aspirin when he was found to have severe symptomatic anemia, but has restarted because of his history of CVA-continue for now, but can reevaluate depending on GI findings Continue atorvastatin 40 mg daily Blood pressure well-controlled

## 2022-11-12 ENCOUNTER — Other Ambulatory Visit: Payer: Self-pay | Admitting: Internal Medicine

## 2022-11-12 DIAGNOSIS — D649 Anemia, unspecified: Secondary | ICD-10-CM

## 2022-11-14 ENCOUNTER — Ambulatory Visit: Payer: BC Managed Care – PPO | Admitting: Neurology

## 2022-11-22 NOTE — Progress Notes (Signed)
Subjective:    Patient ID: Jeffrey [name], male    DOB: Jan 10, 1961, 62 y.o.   MRN: [id number]     HPI Jeffrey [name] is here for follow up of his anemia.  Trying to get lipoma removed before the end of April.   Has had a couple of times that his legs have felt tired.  No SOB, BP, lightheadedness.  No evidence of abn bleeding.   Medications and allergies reviewed with patient and updated if appropriate.  Current Outpatient Medications on File Prior to Visit  Medication Sig Dispense Refill   aspirin EC 81 MG EC tablet Take 1 tablet (81 mg total) by mouth daily.     atorvastatin (LIPITOR) 40 MG tablet TAKE 1 TABLET DAILY AT 6 P.M. 90 tablet 3   ferrous sulfate 325 (65 FE) MG tablet Take 1 tablet (325 mg total) by mouth daily. 30 tablet 0   omeprazole (PRILOSEC) 20 MG capsule Take 1 capsule (20 mg total) by mouth daily. 90 capsule 3   tadalafil (CIALIS) 20 MG tablet Take 0.5-1 tablets (10-20 mg total) by mouth every other day as needed for erectile dysfunction. 20 tablet 5   No current facility-administered medications on file prior to visit.     Review of Systems  Constitutional:  Negative for fatigue and fever.  Respiratory:  Negative for shortness of breath.   Cardiovascular:  Negative for chest pain and palpitations.  Gastrointestinal:  Negative for blood in stool (no black stool).  Genitourinary:  Negative for hematuria.  Neurological:  Negative for dizziness, light-headedness and headaches.       Objective:   Vitals:   11/23/22 1045  BP: 122/74  Pulse: 67  Temp: 97.9 F (36.6 C)  SpO2: 99%   BP Readings from Last 3 Encounters:  11/23/22 122/74  11/10/22 126/80  10/29/22 138/81   Wt Readings from Last 3 Encounters:  11/23/22 158 lb (71.7 kg)  11/10/22 158 lb (71.7 kg)  10/28/22 160 lb (72.6 kg)   Body mass index is 24.02 kg/m.    Physical Exam Constitutional:      General: He is not in acute distress.    Appearance: Normal appearance. He is not  ill-appearing.  HENT:     Head: Normocephalic and atraumatic.  Eyes:     Conjunctiva/sclera: Conjunctivae normal.  Cardiovascular:     Rate and Rhythm: Normal rate and regular rhythm.     Heart sounds: Normal heart sounds.  Pulmonary:     Effort: Pulmonary effort is normal. No respiratory distress.     Breath sounds: Normal breath sounds. No wheezing or rales.  Musculoskeletal:     Right lower leg: No edema.     Left lower leg: No edema.  Skin:    General: Skin is warm and dry.     Findings: No rash.     Comments: Lipoma prox right forearm  Neurological:     Mental Status: He is alert. Mental status is at baseline.  Psychiatric:        Mood and Affect: Mood normal.        Lab Results  Component Value Date   WBC 6.4 11/10/2022   HGB 9.7 (L) 11/10/2022   HCT 31.2 (L) 11/10/2022   PLT 346.0 11/10/2022   GLUCOSE 80 10/28/2022   CHOL 103 10/26/2022   TRIG 109.0 10/26/2022   HDL 41.60 10/26/2022   LDLDIRECT 116.0 07/03/2016   LDLCALC 40 10/26/2022   ALT 14 10/28/2022   AST  24 10/28/2022   NA 136 10/28/2022   K 3.9 10/28/2022   CL 103 10/28/2022   CREATININE 0.97 10/28/2022   BUN 13 10/28/2022   CO2 24 10/28/2022   TSH 1.59 10/26/2022   PSA 0.33 10/26/2022   INR 1.0 10/23/2019   HGBA1C 5.5 06/28/2021     Assessment & Plan:    See Problem List for Assessment and Plan of chronic medical problems.

## 2022-11-22 NOTE — Patient Instructions (Signed)
   A referral was ordered for surgery - call them Monday to see if  you can schedule an appointment.   [facility] Address: [address] [place], [place], [place] [address] Phone: [phone]     Blood work was ordered.   The lab is on the first floor.    Medications changes include :   none

## 2022-11-23 ENCOUNTER — Ambulatory Visit: Payer: BC Managed Care – PPO | Admitting: Gastroenterology

## 2022-11-23 ENCOUNTER — Ambulatory Visit: Payer: BC Managed Care – PPO | Admitting: Adult Health

## 2022-11-23 ENCOUNTER — Ambulatory Visit: Payer: BC Managed Care – PPO | Admitting: Internal Medicine

## 2022-11-23 ENCOUNTER — Encounter: Payer: Self-pay | Admitting: Internal Medicine

## 2022-11-23 VITALS — BP 122/74 | HR 67 | Temp 97.9°F | Ht 68.0 in | Wt 158.0 lb

## 2022-11-23 DIAGNOSIS — D1720 Benign lipomatous neoplasm of skin and subcutaneous tissue of unspecified limb: Secondary | ICD-10-CM

## 2022-11-23 DIAGNOSIS — D509 Iron deficiency anemia, unspecified: Secondary | ICD-10-CM

## 2022-11-23 DIAGNOSIS — Z8673 Personal history of transient ischemic attack (TIA), and cerebral infarction without residual deficits: Secondary | ICD-10-CM

## 2022-11-23 DIAGNOSIS — D649 Anemia, unspecified: Secondary | ICD-10-CM | POA: Diagnosis not present

## 2022-11-23 LAB — CBC WITH DIFFERENTIAL/PLATELET
Basophils Absolute: 0.1 10*3/uL (ref 0.0–0.1)
Basophils Relative: 1.2 % (ref 0.0–3.0)
Eosinophils Absolute: 0.5 10*3/uL (ref 0.0–0.7)
Eosinophils Relative: 8.5 % — ABNORMAL HIGH (ref 0.0–5.0)
HCT: 31.9 % — ABNORMAL LOW (ref 39.0–52.0)
Hemoglobin: 10.2 g/dL — ABNORMAL LOW (ref 13.0–17.0)
Lymphocytes Relative: 21.2 % (ref 12.0–46.0)
Lymphs Abs: 1.2 10*3/uL (ref 0.7–4.0)
MCHC: 31.9 g/dL (ref 30.0–36.0)
MCV: 75.7 fl — ABNORMAL LOW (ref 78.0–100.0)
Monocytes Absolute: 0.5 10*3/uL (ref 0.1–1.0)
Monocytes Relative: 9.5 % (ref 3.0–12.0)
Neutro Abs: 3.3 10*3/uL (ref 1.4–7.7)
Neutrophils Relative %: 59.6 % (ref 43.0–77.0)
Platelets: 294 10*3/uL (ref 150.0–400.0)
RBC: 4.22 Mil/uL (ref 4.22–5.81)
RDW: 30.6 % — ABNORMAL HIGH (ref 11.5–15.5)
WBC: 5.5 10*3/uL (ref 4.0–10.5)

## 2022-11-23 LAB — IBC PANEL
Iron: 205 ug/dL — ABNORMAL HIGH (ref 42–165)
Saturation Ratios: 46.9 % (ref 20.0–50.0)
TIBC: 436.8 ug/dL (ref 250.0–450.0)
Transferrin: 312 mg/dL (ref 212.0–360.0)

## 2022-11-23 LAB — B12 AND FOLATE PANEL
Folate: 15.3 ng/mL (ref >5.9)
Vitamin B-12: 254 pg/mL (ref 211–911)

## 2022-11-23 LAB — FERRITIN: Ferritin: 15.3 ng/mL — ABNORMAL LOW (ref 22.0–322.0)

## 2022-11-23 NOTE — Assessment & Plan Note (Signed)
Referral to surgery for possible removal

## 2022-11-23 NOTE — Assessment & Plan Note (Signed)
Subacute S/p transfusion in ED Hemoccult neg Taking iron daily No signs of active GI bleed To see GI and likely will have EGD/colonoscopy Recheck cbc, iron, reticulocytes, haptoglobin, B12, folate Recheck hemoccult May need to see hem/onc

## 2022-11-23 NOTE — Assessment & Plan Note (Signed)
H/o CVA No signs of active bleed Continue ASA 81 mg daily unless anemia is worse Continue atorvastatin 40 mg daily

## 2022-11-24 LAB — HAPTOGLOBIN: Haptoglobin: 182 mg/dL (ref 43–212)

## 2022-11-24 LAB — RETICULOCYTES
ABS Retic: 89040 cells/uL (ref 25000–90000)
Retic Ct Pct: 2.1 %

## 2022-11-24 LAB — LACTATE DEHYDROGENASE: LDH: 133 U/L (ref 120–250)

## 2022-11-27 ENCOUNTER — Encounter: Payer: Self-pay | Admitting: Internal Medicine

## 2022-11-28 ENCOUNTER — Other Ambulatory Visit: Payer: Self-pay

## 2022-11-28 ENCOUNTER — Encounter: Payer: Self-pay | Admitting: Gastroenterology

## 2022-11-28 MED ORDER — TADALAFIL 20 MG PO TABS: 10.0000 mg | ORAL_TABLET | ORAL | 5 refills | Status: DC | PRN

## 2022-12-01 ENCOUNTER — Telehealth: Payer: Self-pay | Admitting: Internal Medicine

## 2022-12-01 NOTE — Telephone Encounter (Signed)
Called [facility] spoke w/Linda/pharmacist per MD ok to cut in half. Instructions to take 1/2-1 tab prn for ED.Jeffrey KitchenJohny [name]

## 2022-12-01 NOTE — Telephone Encounter (Signed)
Pt called stating the pharmacy will not refilled his medication now because the wording of the direction isn't right on the medication.  tadalafil (CIALIS) 20 MG tablet

## 2022-12-06 ENCOUNTER — Telehealth: Payer: Self-pay | Admitting: Internal Medicine

## 2022-12-06 DIAGNOSIS — D509 Iron deficiency anemia, unspecified: Secondary | ICD-10-CM

## 2022-12-06 NOTE — Telephone Encounter (Signed)
Please call him - see if he will come in today to recheck his labs since he is having the procedure tomorrow.

## 2022-12-06 NOTE — Telephone Encounter (Signed)
Called and left message for patient.  Also sent mychart message regarding labs.

## 2022-12-06 NOTE — Telephone Encounter (Signed)
Attempted to reach patient again and no answer.  If he calls back please get him set for lab appointment.  Thanks

## 2022-12-07 ENCOUNTER — Ambulatory Visit: Admit: 2022-12-07 | Payer: BC Managed Care – PPO | Admitting: Plastic Surgery

## 2022-12-07 SURGERY — EXCISION LIPOMA
Anesthesia: General | Laterality: Right

## 2022-12-11 NOTE — Telephone Encounter (Signed)
Patient responded via my-chart.  He is coming by on Wednesday for labs.

## 2022-12-12 ENCOUNTER — Encounter: Payer: Self-pay | Admitting: Internal Medicine

## 2022-12-12 NOTE — Progress Notes (Deleted)
12/12/2022 Jeffrey [name] [id number] 1960-11-14  Referring provider: Binnie Rail, MD Primary GI doctor: Dr. Fuller Plan  ASSESSMENT AND PLAN:   There are no diagnoses linked to this encounter.   Patient Care Team: Binnie Rail, MD as PCP - General (Internal Medicine)  HISTORY OF PRESENT ILLNESS: 62 y.o. male with a past medical history of hyperlipidemia, GERD, status post esophageal dilatation, adenomatous colon polyps, AVM and others listed below presents for evaluation of ***.  09/02/2018 EGD for abnormal barium swallow benign-appearing esophageal stenosis dilated, normal stomach, normal duodenum 10/23/2017 colonoscopy for personal history of adenomatous colon polyps single medium sized angiodysplastic lesion cecum, 8 mm polyp descending colon, internal hemorrhoids grade 1 recall 5 years 10/28/2022 seen in the ER with low hemoglobin and fatigue for several weeks.  Denies any overt GI bleeding.  No chest pain or shortness of breath.  Hemoglobin 6 with associated microcytosis 68, normal platelets normal WBC.  Iron 11, saturations to status post 1 PRBC Most recent labs reviewed show 11/23/2022 saw Dr. Quay Burow outpatient for IDA CBC improved on iron to 10.2, ferritin 15, iron 205, saturation is 46, normal B12 and folate haptoglobin LDH retics  He  reports that he has never smoked. His smokeless tobacco use includes snuff. He reports current alcohol use. He reports that he does not use drugs.  RELEVANT LABS AND IMAGING: CBC    Component Value Date/Time   WBC 5.5 11/23/2022 1134   RBC 4.22 11/23/2022 1134   HGB 10.2 (L) 11/23/2022 1134   HCT 31.9 (L) 11/23/2022 1134   PLT 294.0 11/23/2022 1134   MCV 75.7 (L) 11/23/2022 1134   MCH 18.3 (L) 10/28/2022 1333   MCHC 31.9 11/23/2022 1134   RDW 30.6 (H) 11/23/2022 1134   LYMPHSABS 1.2 11/23/2022 1134   MONOABS 0.5 11/23/2022 1134   EOSABS 0.5 11/23/2022 1134   BASOSABS 0.1 11/23/2022 1134   Recent Labs    10/26/22 1209  10/27/22 1501 10/28/22 1333 11/10/22 1106 11/23/22 1134  HGB 5.9 Repeated and verified X2.* 6.2 Repeated and verified X2.* 5.7* 9.7* 10.2*     CMP     Component Value Date/Time   NA 136 10/28/2022 1333   K 3.9 10/28/2022 1333   CL 103 10/28/2022 1333   CO2 24 10/28/2022 1333   GLUCOSE 80 10/28/2022 1333   BUN 13 10/28/2022 1333   CREATININE 0.97 10/28/2022 1333   CREATININE 0.99 05/18/2020 1018   CALCIUM 8.8 (L) 10/28/2022 1333   PROT 7.4 10/28/2022 1333   ALBUMIN 3.5 10/28/2022 1333   AST 24 10/28/2022 1333   ALT 14 10/28/2022 1333   ALKPHOS 59 10/28/2022 1333   BILITOT 0.2 (L) 10/28/2022 1333   GFRNONAA >60 10/28/2022 1333   GFRAA >60 10/23/2019 2230      Latest Ref Rng & Units 10/28/2022    1:33 PM 10/26/2022   12:09 PM 05/18/2020   10:18 AM  Hepatic Function  Total Protein 6.5 - 8.1 g/dL 7.4  7.2  6.8   Albumin 3.5 - 5.0 g/dL 3.5  3.8    AST 15 - 41 U/L 24  19  17    ALT 0 - 44 U/L 14  12  13    Alk Phosphatase 38 - 126 U/L 59  65    Total Bilirubin 0.3 - 1.2 mg/dL 0.2  0.3  0.6       Current Medications:    Current Outpatient Medications (Cardiovascular):    atorvastatin (LIPITOR) 40 MG tablet, TAKE  1 TABLET DAILY AT 6 P.M.   tadalafil (CIALIS) 20 MG tablet, Take 0.5-1 tablets (10-20 mg total) by mouth every other day as needed for erectile dysfunction.   Current Outpatient Medications (Analgesics):    aspirin EC 81 MG EC tablet, Take 1 tablet (81 mg total) by mouth daily.  Current Outpatient Medications (Hematological):    ferrous sulfate 325 (65 FE) MG tablet, Take 1 tablet (325 mg total) by mouth daily.  Current Outpatient Medications (Other):    omeprazole (PRILOSEC) 20 MG capsule, Take 1 capsule (20 mg total) by mouth daily.  Medical History:  Past Medical History:  Diagnosis Date   Acute CVA (cerebrovascular accident) [facility])    Allergy    Colonic polyp    adenomatous   Esophageal stricture    GERD (gastroesophageal reflux disease)     Hemorrhage, anal or rectal    Hiatal hernia    Hyperlipidemia    Reflux esophagitis    Allergies: No Known Allergies   Surgical History:  He  has a past surgical history that includes polyectomy (2008 & 2014); Colonoscopy; Polypectomy; Upper gastrointestinal endoscopy; and Lipoma excision. Family History:  His family history includes Diabetes in his brother and mother; Heart disease in his brother and father.  REVIEW OF SYSTEMS  : All other systems reviewed and negative except where noted in the History of Present Illness.  PHYSICAL EXAM: There were no vitals taken for this visit. General Appearance: Well nourished, in no apparent distress. Head:   Normocephalic and atraumatic. Eyes:  sclerae anicteric,conjunctive pink  Respiratory: Respiratory effort normal, BS equal bilaterally without rales, rhonchi, wheezing. Cardio: RRR with no MRGs. Peripheral pulses intact.  Abdomen: Soft,  {BlankSingle:19197::"Flat","Obese","Non-distended"} ,active bowel sounds. {actendernessAB:27319} tenderness {anatomy; site abdomen:5010}. {BlankMultiple:19196::"Without guarding","With guarding","Without rebound","With rebound"}. No masses. Rectal: {acrectalexam:27461} Musculoskeletal: Full ROM, {PSY - GAIT AND STATION:22860} gait. {With/Without:304960234} edema. Skin:  Dry and intact without significant lesions or rashes Neuro: Alert and  oriented x4;  No focal deficits. Psych:  Cooperative. Normal mood and affect.    Vladimir Crofts, PA-C 12:45 PM

## 2022-12-13 ENCOUNTER — Ambulatory Visit: Payer: BC Managed Care – PPO | Admitting: Physician Assistant

## 2022-12-13 ENCOUNTER — Other Ambulatory Visit (INDEPENDENT_AMBULATORY_CARE_PROVIDER_SITE_OTHER): Payer: BC Managed Care – PPO

## 2022-12-13 DIAGNOSIS — K219 Gastro-esophageal reflux disease without esophagitis: Secondary | ICD-10-CM

## 2022-12-13 DIAGNOSIS — D509 Iron deficiency anemia, unspecified: Secondary | ICD-10-CM

## 2022-12-13 DIAGNOSIS — Z8719 Personal history of other diseases of the digestive system: Secondary | ICD-10-CM

## 2022-12-13 DIAGNOSIS — Z86010 Personal history of colonic polyps: Secondary | ICD-10-CM

## 2022-12-13 DIAGNOSIS — K222 Esophageal obstruction: Secondary | ICD-10-CM

## 2022-12-13 LAB — CBC WITH DIFFERENTIAL/PLATELET
Basophils Absolute: 0.1 10*3/uL (ref 0.0–0.1)
Basophils Relative: 1.7 % (ref 0.0–3.0)
Eosinophils Absolute: 0.5 10*3/uL (ref 0.0–0.7)
Eosinophils Relative: 10.3 % — ABNORMAL HIGH (ref 0.0–5.0)
HCT: 37.4 % — ABNORMAL LOW (ref 39.0–52.0)
Hemoglobin: 12.3 g/dL — ABNORMAL LOW (ref 13.0–17.0)
Lymphocytes Relative: 25.6 % (ref 12.0–46.0)
Lymphs Abs: 1.2 10*3/uL (ref 0.7–4.0)
MCHC: 32.8 g/dL (ref 30.0–36.0)
MCV: 81.4 fl (ref 78.0–100.0)
Monocytes Absolute: 0.6 10*3/uL (ref 0.1–1.0)
Monocytes Relative: 12.1 % — ABNORMAL HIGH (ref 3.0–12.0)
Neutro Abs: 2.4 10*3/uL (ref 1.4–7.7)
Neutrophils Relative %: 50.3 % (ref 43.0–77.0)
Platelets: 294 10*3/uL (ref 150.0–400.0)
RBC: 4.6 Mil/uL (ref 4.22–5.81)
RDW: 30 % — ABNORMAL HIGH (ref 11.5–15.5)
WBC: 4.7 10*3/uL (ref 4.0–10.5)

## 2022-12-13 LAB — FERRITIN: Ferritin: 25.5 ng/mL (ref 22.0–322.0)

## 2022-12-13 LAB — IBC PANEL
Iron: 34 ug/dL — ABNORMAL LOW (ref 42–165)
Saturation Ratios: 7.9 % — ABNORMAL LOW (ref 20.0–50.0)
TIBC: 429.8 ug/dL (ref 250.0–450.0)
Transferrin: 307 mg/dL (ref 212.0–360.0)

## 2022-12-14 DIAGNOSIS — S5010XA Contusion of unspecified forearm, initial encounter: Secondary | ICD-10-CM | POA: Diagnosis not present

## 2022-12-22 ENCOUNTER — Telehealth: Payer: Self-pay | Admitting: Internal Medicine

## 2022-12-22 NOTE — Telephone Encounter (Signed)
Patient is needing a prior authorization on his cialis (tadalifil) - a 90 day supply - 18 total - Please send this [employer].

## 2022-12-28 ENCOUNTER — Ambulatory Visit (INDEPENDENT_AMBULATORY_CARE_PROVIDER_SITE_OTHER): Payer: BC Managed Care – PPO | Admitting: Neurology

## 2022-12-28 ENCOUNTER — Encounter: Payer: Self-pay | Admitting: Neurology

## 2022-12-28 VITALS — BP 156/106 | HR 92 | Ht 68.0 in | Wt 154.0 lb

## 2022-12-28 DIAGNOSIS — R202 Paresthesia of skin: Secondary | ICD-10-CM | POA: Diagnosis not present

## 2022-12-28 DIAGNOSIS — Z8673 Personal history of transient ischemic attack (TIA), and cerebral infarction without residual deficits: Secondary | ICD-10-CM

## 2022-12-28 NOTE — Progress Notes (Signed)
Patient: Jeffrey [name] Date of Birth: 02-28-1961  Reason for Visit: Follow up History from: Patient Primary Neurologist: Sethi/Jessica   ASSESSMENT AND PLAN [name] is a 62 y.o. year old male presented with left facial droop and dysarthria on 10/23/2019 with stroke work-up revealing right BG/PLIC stroke secondary to small vessel disease source. Vascular risk factors include HLD, smokeless tobacco use and cocaine use.  He is doing well with practically no residual deficits. He does have intermittent/rare right-sided numbness/tingling of unknown etiology, prior extensive workup largely unremarkable.   -The numbness to his right fingertips has essentially resolved, he will continue to monitor, if it increases we can consider EMG nerve conduction, he denies any neck pain; his neurological exam is reassuring today -Since his initial stroke in 2021 he has had intermittent right-sided numbness/tingling -His stroke occurred on the right side, his right sided paresthesia does not fit with stroke pathway -Continue aspirin 81 mg daily, Lipitor 40 mg daily for secondary stroke prevention -Strict management of vascular risk factors with a goal BP less than 130/90, A1c less than 7.0, LDL less than 70 for secondary stroke prevention -He is on B12 supplement -Continue close follow-up with PCP return to our office on an as-needed basis  HISTORY OF PRESENT ILLNESS: Today 12/28/22 Here today for acute visit. For last month numbness to right handed fingers. It waxes and wanes. Sensation of numbness just to finger tips. He felt his right bicep was tender, is better today. Numbness essentially gone. Has Lipoma to right forearm, actually told fluid build up from surgery. Has gotten smaller. Denies any neck pain. No other neuro symptoms. His strength to the right arm is still very strong. 10/26/22 HGB was 5.9, admitted for blood transfusion. Last check 12/13/22 12.3. Hi B 12 was low 254, he is on B12  supplement now.   HISTORY  Update 10/26/2022 JM: Patient returns for follow-up after prior visit over 1 year ago.  Patient has been stable from a stroke standpoint without any new stroke/TIA symptoms. Does still have some issues with transient/rare right sided numbness and tingling, this has been going on since his prior visit and denies any worsening. Will resolve after short duration.     His main concern today is in regards to decreased energy levels and feeling "lazy" over the past year. He also has noted gradual worsening of shortness of breath and fatigue on exertion. He did have covid back in Oct/Nov and questions if some of these symptoms could be from that. He does have an appointment this morning with PCP and plans on discussing further at that time.   He also needs clearance to get a lipoma removed on his right forearm, not yet scheduled. Per patient, surgeon requesting to hold aspirin 1 week prior to surgery and 2 weeks after.    Compliant on aspirin and atorvastatin Blood pressure 132/83  REVIEW OF SYSTEMS: Out of a complete 14 system review of symptoms, the patient complains only of the following symptoms, and all other reviewed systems are negative.  See HPI  ALLERGIES: No Known Allergies  HOME MEDICATIONS: Outpatient Medications Prior to Visit  Medication Sig Dispense Refill   aspirin EC 81 MG EC tablet Take 1 tablet (81 mg total) by mouth daily.     atorvastatin (LIPITOR) 40 MG tablet TAKE 1 TABLET DAILY AT 6 P.M. 90 tablet 3   Cyanocobalamin (VITAMIN B 12) 500 MCG TABS Take 500 mcg by mouth daily.     ferrous sulfate 325 (  65 FE) MG tablet Take 1 tablet (325 mg total) by mouth daily. 30 tablet 0   omeprazole (PRILOSEC) 20 MG capsule Take 1 capsule (20 mg total) by mouth daily. 90 capsule 3   tadalafil (CIALIS) 20 MG tablet Take 0.5-1 tablets (10-20 mg total) by mouth every other day as needed for erectile dysfunction. 18 tablet 5   No facility-administered medications  prior to visit.    PAST MEDICAL HISTORY: Past Medical History:  Diagnosis Date   Acute CVA (cerebrovascular accident)    Allergy    Colonic polyp    adenomatous   Esophageal stricture    GERD (gastroesophageal reflux disease)    Hemorrhage, anal or rectal    Hiatal hernia    Hyperlipidemia    Reflux esophagitis     PAST SURGICAL HISTORY: Past Surgical History:  Procedure Laterality Date   COLONOSCOPY     LIPOMA EXCISION     polyectomy  2008 & 2014   POLYPECTOMY     UPPER GASTROINTESTINAL ENDOSCOPY      FAMILY HISTORY: Family History  Problem Relation Age of Onset   Diabetes Mother    Heart disease Father    Diabetes Brother    Heart disease Brother    Colon cancer Neg Hx    Stomach cancer Neg Hx    Esophageal cancer Neg Hx    Rectal cancer Neg Hx    Liver cancer Neg Hx    Pancreatic cancer Neg Hx     SOCIAL HISTORY: Social History   Socioeconomic History   Marital status: Married    Spouse name: Not on file   Number of children: Not on file   Years of education: Not on file   Highest education level: Not on file  Occupational History   Not on file  Tobacco Use   Smoking status: Never   Smokeless tobacco: Current    Types: Snuff  Vaping Use   Vaping Use: Never used  Substance and Sexual Activity   Alcohol use: Yes    Alcohol/week: 1.0 standard drink of alcohol    Types: 1 Shots of liquor per week    Comment: occasionally   Drug use: No   Sexual activity: Yes  Other Topics Concern   Not on file  Social History Narrative   [employer] a lot      No regular exercise   Social Determinants of Health   Financial Resource Strain: Not on file  Food Insecurity: Not on file  Transportation Needs: Not on file  Physical Activity: Not on file  Stress: Not on file  Social Connections: Not on file  Intimate Partner Violence: Not on file   PHYSICAL EXAM  Vitals:   12/28/22 1405  BP: (!) 156/106  Pulse: 92  Weight: 154 lb (69.9 kg)   Height:  (1.727 m)   Body mass index is 23.42 kg/m.  Generalized: Well developed, in no acute distress  Neurological examination  Mentation: Alert oriented to time, place, history taking. Follows all commands speech and language fluent Cranial nerve II-XII: Pupils were equal round reactive to light. Extraocular movements were full, visual field were full on confrontational test. Facial sensation and strength were normal.  Head turning and shoulder shrug  were normal and symmetric. Motor: The motor testing reveals 5 over 5 strength of all 4 extremities. Good symmetric motor tone is noted throughout.  Good grip strength bilaterally. Sensory: Sensory testing is intact to soft touch on all 4 extremities. No evidence  of extinction is noted.  Coordination: Cerebellar testing reveals good finger-nose-finger and heel-to-shin bilaterally.  Gait and station: Gait is normal.  Reflexes: Deep tendon reflexes are symmetric and normal bilaterally.   DIAGNOSTIC DATA (LABS, IMAGING, TESTING) - I reviewed patient records, labs, notes, testing and imaging myself where available.  Lab Results  Component Value Date   WBC 4.7 12/13/2022   HGB 12.3 (L) 12/13/2022   HCT 37.4 (L) 12/13/2022   MCV 81.4 12/13/2022   PLT 294.0 12/13/2022      Component Value Date/Time   NA 136 10/28/2022 1333   K 3.9 10/28/2022 1333   CL 103 10/28/2022 1333   CO2 24 10/28/2022 1333   GLUCOSE 80 10/28/2022 1333   BUN 13 10/28/2022 1333   CREATININE 0.97 10/28/2022 1333   CREATININE 0.99 05/18/2020 1018   CALCIUM 8.8 (L) 10/28/2022 1333   PROT 7.4 10/28/2022 1333   ALBUMIN 3.5 10/28/2022 1333   AST 24 10/28/2022 1333   ALT 14 10/28/2022 1333   ALKPHOS 59 10/28/2022 1333   BILITOT 0.2 (L) 10/28/2022 1333   GFRNONAA >60 10/28/2022 1333   GFRAA >60 10/23/2019 2230   Lab Results  Component Value Date   CHOL 103 10/26/2022   HDL 41.60 10/26/2022   LDLCALC 40 10/26/2022   LDLDIRECT 116.0 07/03/2016   TRIG  109.0 10/26/2022   CHOLHDL 2 10/26/2022   Lab Results  Component Value Date   HGBA1C 5.5 06/28/2021   Lab Results  Component Value Date   VITAMINB12 254 11/23/2022   Lab Results  Component Value Date   TSH 1.59 10/26/2022    Margie Ege, AGNP-C, DNP 12/28/2022, 2:16 PM [facility] [address], [address] [place], [place] [address] [phone]

## 2023-01-03 ENCOUNTER — Other Ambulatory Visit (HOSPITAL_COMMUNITY): Payer: Self-pay

## 2023-01-05 ENCOUNTER — Other Ambulatory Visit (HOSPITAL_COMMUNITY): Payer: Self-pay

## 2023-01-08 ENCOUNTER — Other Ambulatory Visit: Payer: Self-pay

## 2023-01-08 ENCOUNTER — Other Ambulatory Visit (HOSPITAL_COMMUNITY): Payer: Self-pay

## 2023-01-08 MED ORDER — TADALAFIL 20 MG PO TABS: 10.0000 mg | ORAL_TABLET | ORAL | 5 refills | Status: AC | PRN

## 2023-01-08 NOTE — Telephone Encounter (Signed)
Patient would like a call back from [place]. He said he sent a message via MyChart and would like to follow up. Best callback is [phone].

## 2023-01-09 NOTE — Telephone Encounter (Signed)
Message sent to patient via my-chart from previous message he sent.

## 2023-01-25 ENCOUNTER — Encounter: Payer: Self-pay | Admitting: Nurse Practitioner

## 2023-02-06 ENCOUNTER — Ambulatory Visit: Payer: BC Managed Care – PPO | Admitting: Physician Assistant

## 2023-02-19 ENCOUNTER — Ambulatory Visit: Payer: BC Managed Care – PPO | Admitting: Neurology

## 2023-04-10 ENCOUNTER — Encounter: Payer: Self-pay | Admitting: Nurse Practitioner

## 2023-04-10 ENCOUNTER — Other Ambulatory Visit (INDEPENDENT_AMBULATORY_CARE_PROVIDER_SITE_OTHER): Payer: 59

## 2023-04-10 ENCOUNTER — Telehealth: Payer: Self-pay

## 2023-04-10 ENCOUNTER — Ambulatory Visit: Payer: 59 | Admitting: Nurse Practitioner

## 2023-04-10 VITALS — BP 110/82 | HR 76 | Ht 68.5 in | Wt 153.5 lb

## 2023-04-10 DIAGNOSIS — Z86010 Personal history of colonic polyps: Secondary | ICD-10-CM | POA: Diagnosis not present

## 2023-04-10 DIAGNOSIS — K219 Gastro-esophageal reflux disease without esophagitis: Secondary | ICD-10-CM

## 2023-04-10 DIAGNOSIS — D509 Iron deficiency anemia, unspecified: Secondary | ICD-10-CM | POA: Diagnosis not present

## 2023-04-10 LAB — IBC + FERRITIN
Ferritin: 19.6 ng/mL — ABNORMAL LOW (ref 22.0–322.0)
Iron: 132 ug/dL (ref 42–165)
Saturation Ratios: 36 % (ref 20.0–50.0)
TIBC: 366.8 ug/dL (ref 250.0–450.0)
Transferrin: 262 mg/dL (ref 212.0–360.0)

## 2023-04-10 LAB — CBC
HCT: 44.1 % (ref 39.0–52.0)
Hemoglobin: 15 g/dL (ref 13.0–17.0)
MCHC: 33.9 g/dL (ref 30.0–36.0)
MCV: 95.5 fl (ref 78.0–100.0)
Platelets: 218 10*3/uL (ref 150.0–400.0)
RBC: 4.62 Mil/uL (ref 4.22–5.81)
RDW: 14.4 % (ref 11.5–15.5)
WBC: 5.6 10*3/uL (ref 4.0–10.5)

## 2023-04-10 MED ORDER — NA SULFATE-K SULFATE-MG SULF 17.5-3.13-1.6 GM/177ML PO SOLN: 1.0000 | Freq: Once | ORAL | 0 refills | Status: AC

## 2023-04-10 NOTE — Patient Instructions (Addendum)
Your provider has requested that you go to the basement level for lab work before leaving today. Press "B" on the elevator. The lab is located at the first door on the left as you exit the elevator.  We have sent the following medications to your pharmacy for you to pick up at your convenience: Suprep  Hold your iron supplement 1 week prior to your procedure.  You can restart once the procedure is completed  You have been scheduled for an endoscopy and colonoscopy. Please follow the written instructions given to you at your visit today.  Please pick up your prep supplies at the pharmacy within the next 1-3 days.  If you use inhalers (even only as needed), please bring them with you on the day of your procedure.  DO NOT TAKE 7 DAYS PRIOR TO TEST- Trulicity (dulaglutide) Ozempic, Wegovy (semaglutide) Mounjaro (tirzepatide) Bydureon Bcise (exanatide extended release)  DO NOT TAKE 1 DAY PRIOR TO YOUR TEST Rybelsus (semaglutide) Adlyxin (lixisenatide) Victoza (liraglutide) Byetta (exanatide) ___________________________________________________________________________  Due to recent changes in healthcare laws, you may see the results of your imaging and laboratory studies on MyChart before your provider has had a chance to review them.  We understand that in some cases there may be results that are confusing or concerning to you. Not all laboratory results come back in the same time frame and the provider may be waiting for multiple results in order to interpret others.  Please give [place] 48 hours in order for your provider to thoroughly review all the results before contacting the office for clarification of your results.   Thank you for trusting me with your gastrointestinal care!   Alcide Evener, CRNP

## 2023-04-10 NOTE — Telephone Encounter (Signed)
I left Jeffrey [name] a message on his mobile # to please come back to our lab at his convenience to have a TTG and IGA drawn. No appointment needed. They were not able to add it on to blood drawn today. I was unable to leave a message on his home # , voice mail full.

## 2023-04-10 NOTE — Progress Notes (Signed)
DD, see secure chat msg I sent, please contact our lab and add tTG levels and IgA as requested by Dr. Russella Dar.  Thank you

## 2023-04-10 NOTE — Progress Notes (Signed)
04/10/2023 Jeffrey [name] [id number] [birth year]   CHIEF COMPLAINT: Discuss scheduling a colonoscopy  HISTORY OF PRESENT ILLNESS: Jeffrey [name] is a 62 year old male, history of hyperlipidemia, CVA 10/2019, iron deficiency anemia, hiatal hernia, esophageal stricture, GERD, colonic AVM and colon polyps.  He is known by Dr. Russella Dar.  He presents today as referred by Dr. Cheryll Cockayne for further evaluation regarding iron deficiency anemia.  He developed fatigue and he was seen by Ihor Austin NP 10/26/2022 and laboratory studies showed a Hg level of 5.9 down from a hemoglobin level of 14.13 December 2020.  Repeat Hg 6.2 on 10/27/2022.  He was sent to the ED 10/28/2022 for further evaluation.  Hemoglobin level 5.7.  MCV 68.8.  Iron 11.  Ferritin 3.  He was transfused 2 units of PRBCs.  FOBT negative.  He was clinically stable and was discharged home on Ferrous Sulfate 325 mg daily.  His most recent hemoglobin level was 12.3 on 12/13/2022.  He denies having any dysphagia or heartburn.  He remains on Omeprazole 20 mg daily.  He rarely takes Aleve, maybe 2 or 3 times yearly.  No upper or lower abdominal pain.  He is passing normal formed brown bowel movement daily.  No rectal bleeding or black stools.  Appetite is good.  Weight is stable.  No fever or night sweats.  He underwent an EGD and colonoscopy 09/02/2018 by Dr. Russella Dar.  The EGD identified a benign-appearing esophageal stenosis which was dilated, esophageal showed chronic inflammation without intestinal metaplasia or dysplasia.  The stomach and duodenum were normal.  The colonoscopy identified a single nonbleeding colonic AVM in the cecum and one 8 mm tubular adenomatous polyp was removed from the descending colon.  He was advised to repeat a colonoscopy in 5 years.  No known family history of esophageal, gastric or colorectal cancer.     Latest Ref Rng & Units 12/13/2022    3:46 PM 11/23/2022   11:34 AM 11/10/2022   11:06 AM  CBC  WBC 4.0 - 10.5 K/uL 4.7   5.5  6.4   Hemoglobin 13.0 - 17.0 g/dL 25.3  66.4  9.7   Hematocrit 39.0 - 52.0 % 37.4  31.9  31.2   Platelets 150.0 - 400.0 K/uL 294.0  294.0  346.0    MCV 81.4.  Labs 12/13/2022: Iron 34.  Iron saturation 7.9.  TIBC 429.8.  Ferritin 25.5.       Latest Ref Rng & Units 10/28/2022    1:33 PM 10/26/2022   12:09 PM 12/25/2020    1:08 PM  CMP  Glucose 70 - 99 mg/dL 80  64  96   BUN 6 - 20 mg/dL 13  16  10    Creatinine 0.61 - 1.24 mg/dL 4.03  4.74  2.59   Sodium 135 - 145 mmol/L 136  139  138   Potassium 3.5 - 5.1 mmol/L 3.9  4.4  4.0   Chloride 98 - 111 mmol/L 103  102  106   CO2 22 - 32 mmol/L 24  31  26    Calcium 8.9 - 10.3 mg/dL 8.8  9.0  8.9   Total Protein 6.5 - 8.1 g/dL 7.4  7.2    Total Bilirubin 0.3 - 1.2 mg/dL 0.2  0.3    Alkaline Phos 38 - 126 U/L 59  65    AST 15 - 41 U/L 24  19    ALT 0 - 44 U/L 14  12      ECHO 10/24/2019:  1. No obvious source of embolus Bubble study not performed. 2. Left ventricular ejection fraction, by estimation, is 60 to 65%. The left ventricle has normal function. The left ventricle has no regional wall motion abnormalities. Left ventricular diastolic parameters were normal. 3. Right ventricular systolic function is normal. The right ventricular size is normal. 4. Redundant subvalvular chordae tendina. The mitral valve is normal in structure and function. No evidence of mitral valve regurgitation. No evidence of mitral stenosis. 5. The aortic valve is normal in structure and function. Aortic valve regurgitation is not visualized. No aortic stenosis is present.  Brain MRI 10/24/2019: Acute infarction in the right basal ganglia/posterior limb internal capsule. Maximal dimension 13 mm. No hemorrhage.  PAST ENDOSCOPIC PROCEDURES:  EGD 09/02/2018: - Benign-appearing esophageal stenosis. Dilated. Biopsied.  - Normal stomach.  - Normal duodenal bulb and second portion of the duodenum. - SQUAMOCOLUMNAR JUNCTION WITH CHRONIC INFLAMMATION - NO INTESTINAL  METAPLASIA, DYSPLASIA OR MALIGNANCY IDENTIFIED  Colonoscopy 10/23/2017: - A single non-bleeding colonic angiodysplastic lesion in the cecum.  - One 8 mm polyp in the descending colon, removed with a cold snare. Resected and retrieved.  - Internal hemorrhoids.  - The examination was otherwise normal on direct and retroflexion views. - 5 year recall colonoscopy  - TUBULAR ADENOMA. - NO HIGH GRADE DYSPLASIA OR MALIGNANCY  Colonoscopy 09/20/2012: -5 mm angiodysplastic lesion at the cecum -2 sessile polyps measuring 3 to 4 mm in the descending and sigmoid colon, -polypectomy performed -Sessile polyp measuring 3 mm in the descending colon, polypectomy performed -Moderate internal hemorrhoids  Surgical [P], descending, sigmoid colon polyps, bx - FRAGMENTS OF TUBULAR ADENOMAS (MULTIPLE); NEGATIVE FOR HIGH GRADE DYSPLASIA OR MALIGNANCY. - HYPERPLASTIC POLYP (X1).  Past Medical History:  Diagnosis Date   Acute CVA (cerebrovascular accident) [facility])    Allergy    Colonic polyp    adenomatous   Esophageal stricture    GERD (gastroesophageal reflux disease)    Hemorrhage, anal or rectal    Hiatal hernia    Hyperlipidemia    Reflux esophagitis    Past Surgical History:  Procedure Laterality Date   COLONOSCOPY     LIPOMA EXCISION     polyectomy  2008 & 2014   POLYPECTOMY     UPPER GASTROINTESTINAL ENDOSCOPY     Social History: He is married.  Non-smoker.  No alcohol use.  Denies drug use.  Family History: Mother and brother with history of diabetes.  Father and brother with heart disease.  No known family history of esophageal, gastric or colon cancer.  No family history of anemia.  No Known Allergies    Outpatient Encounter Medications as of 04/10/2023  Medication Sig   aspirin EC 81 MG EC tablet Take 1 tablet (81 mg total) by mouth daily.   atorvastatin (LIPITOR) 40 MG tablet TAKE 1 TABLET DAILY AT 6 P.M.   Cyanocobalamin (VITAMIN B 12) 500 MCG TABS Take 500 mcg by mouth daily.    ferrous sulfate 325 (65 FE) MG tablet Take 1 tablet (325 mg total) by mouth daily.   omeprazole (PRILOSEC) 20 MG capsule Take 1 capsule (20 mg total) by mouth daily.   tadalafil (CIALIS) 20 MG tablet Take 0.5-1 tablets (10-20 mg total) by mouth every other day as needed for erectile dysfunction.   No facility-administered encounter medications on file as of 04/10/2023.    REVIEW OF SYSTEMS:  Gen: Denies fever, sweats or chills. No weight loss.  CV: Denies chest pain, palpitations or edema. Resp: Denies cough,  shortness of breath of hemoptysis.  GI: See HPI. GU: Denies urinary burning, blood in urine, increased urinary frequency or incontinence. MS: Denies joint pain, muscles aches or weakness. Derm: Denies rash, itchiness, skin lesions or unhealing ulcers. Psych: Denies depression, anxiety, memory loss or confusion. Heme: Denies bruising, easy bleeding. Neuro:  Denies headaches, dizziness or paresthesias. Endo:  Denies any problems with DM, thyroid or adrenal function.  PHYSICAL EXAM: BP 110/82   Pulse 76   Ht 5' 8.5" (1.74 m)   Wt 153 lb 8 oz (69.6 kg)   BMI 23.00 kg/m   General: 62 year old male in no acute distress. Head: Normocephalic and atraumatic. Eyes:  Sclerae non-icteric, conjunctive pink. Ears: Normal auditory acuity. Mouth: Dentition intact. No ulcers or lesions.  Neck: Supple, no lymphadenopathy or thyromegaly.  Lungs: Clear bilaterally to auscultation without wheezes, crackles or rhonchi. Heart: Regular rate and rhythm. No murmur, rub or gallop appreciated.  Abdomen: Soft, nontender, nondistended. No masses. No hepatosplenomegaly. Normoactive bowel sounds x 4 quadrants.  Rectal: Deferred. Musculoskeletal: Symmetrical with no gross deformities. Skin: Warm and dry. No rash or lesions on visible extremities. Extremities: No edema. Neurological: Alert oriented x 4, no focal deficits.  Psychological:  Alert and cooperative. Normal mood and affect.  ASSESSMENT AND  PLAN:  62 year old male with iron deficiency anemia.  Hemoglobin 5.9 on 10/26/2022 with a baseline hemoglobin of 14.4 -> transfused 2 units of PRBCs in the ED then discharged home. History of a single nonbleeding cecal AVM.  No overt GI bleeding. FOBT negative.  On Ferrous Sulfate 325mg  po every day.  His most recent Hg 12.3 on 12/13/2022. -CBC, IBC + Ferritin level  -EGD to include duodenal biopsies to rule out celiac disease and colonoscopy benefits and risks discussed including risk with sedation, risk of bleeding, perforation and infection  -Patient instructed to hold ferrous sulfate for 7 days prior to his EGD/colonoscopy date, restart after procedures completed -Consider small bowel capsule endoscopy and CTAP if EGD and colonoscopy unrevealing  GERD, stable -Continue Omeprazole 20 mg p.o. daily -GERD diet -EGD as ordered above   Benign esophageal stenosis status post dilatation 2019.  No recent dysphagia.  History of colon polyps.  His most recent colonoscopy 10/23/2017 identified 1 8 mm tubular adenomatous polyp removed from the descending colon. -Colonoscopy as ordered above   History of CVA 10/2019 on ASA       CC:  Pincus Sanes, MD

## 2023-04-10 NOTE — Addendum Note (Signed)
Addended by: Rise Paganini on: 04/10/2023 01:10 PM   Modules accepted: Orders

## 2023-04-10 NOTE — Progress Notes (Signed)
Please obtain tTG, IgA to screen for celiac disease.

## 2023-04-12 ENCOUNTER — Other Ambulatory Visit: Payer: 59

## 2023-04-12 DIAGNOSIS — D509 Iron deficiency anemia, unspecified: Secondary | ICD-10-CM

## 2023-04-12 DIAGNOSIS — K219 Gastro-esophageal reflux disease without esophagitis: Secondary | ICD-10-CM

## 2023-04-12 DIAGNOSIS — Z86010 Personal history of colonic polyps: Secondary | ICD-10-CM | POA: Diagnosis not present

## 2023-04-12 NOTE — Telephone Encounter (Signed)
Patient came in 04/12/2023 and had labs drawn.

## 2023-04-16 ENCOUNTER — Encounter: Payer: Self-pay | Admitting: Gastroenterology

## 2023-04-20 ENCOUNTER — Telehealth: Payer: Self-pay | Admitting: Gastroenterology

## 2023-04-20 NOTE — Telephone Encounter (Signed)
Inbound call from patient stating that he is scheduled for a colonoscopy and EGD on  8/13 with Dr. Russella Dar. Patient stated that is wife tested positive for COVID and his son last night. Patient is requesting a call to discuss if he needs to reschedule. Please advise.

## 2023-04-20 NOTE — Telephone Encounter (Signed)
Phoned patient back. Patient is feeling well and does not have any COVID symptoms. Informed patient that he is OK to proceed with the scheduled colonoscopy and instructed him to call us if anything changes. Patient verbalized understanding.

## 2023-04-22 ENCOUNTER — Encounter: Payer: Self-pay | Admitting: Gastroenterology

## 2023-04-23 NOTE — Telephone Encounter (Signed)
Patient communicated Covid 19 syptoms via MyChart to the nurse and was asked to reschedule. He is now scheduled for 05/22/23.

## 2023-04-24 ENCOUNTER — Encounter: Payer: 59 | Admitting: Gastroenterology

## 2023-05-22 ENCOUNTER — Encounter: Payer: Self-pay | Admitting: Gastroenterology

## 2023-05-22 ENCOUNTER — Ambulatory Visit (AMBULATORY_SURGERY_CENTER): Payer: 59 | Admitting: Gastroenterology

## 2023-05-22 VITALS — BP 117/77 | HR 64 | Temp 97.5°F | Resp 12 | Ht 68.0 in | Wt 153.0 lb

## 2023-05-22 DIAGNOSIS — K219 Gastro-esophageal reflux disease without esophagitis: Secondary | ICD-10-CM

## 2023-05-22 DIAGNOSIS — K31819 Angiodysplasia of stomach and duodenum without bleeding: Secondary | ICD-10-CM | POA: Diagnosis not present

## 2023-05-22 DIAGNOSIS — K319 Disease of stomach and duodenum, unspecified: Secondary | ICD-10-CM | POA: Diagnosis not present

## 2023-05-22 DIAGNOSIS — D509 Iron deficiency anemia, unspecified: Secondary | ICD-10-CM | POA: Diagnosis not present

## 2023-05-22 DIAGNOSIS — Z86010 Personal history of colonic polyps: Secondary | ICD-10-CM | POA: Diagnosis not present

## 2023-05-22 DIAGNOSIS — K2950 Unspecified chronic gastritis without bleeding: Secondary | ICD-10-CM | POA: Diagnosis not present

## 2023-05-22 DIAGNOSIS — K317 Polyp of stomach and duodenum: Secondary | ICD-10-CM | POA: Diagnosis not present

## 2023-05-22 MED ORDER — SODIUM CHLORIDE 0.9 % IV SOLN: 500.0000 mL | Freq: Once | INTRAVENOUS | Status: DC

## 2023-05-22 NOTE — Op Note (Signed)
[facility] Patient Name: Jeffrey [name] Procedure Date: 05/22/2023 2:23 PM MRN: [id number] Endoscopist: Meryl Dare , MD, [phone] Age: 62 Referring MD:  Date of Birth: [birth year] Gender: Male Account #: [id number] Procedure:                Upper GI endoscopy Indications:              Unexplained iron deficiency anemia,                            Gastroesophageal reflux disease Medicines:                Monitored Anesthesia Care Procedure:                Pre-Anesthesia Assessment:                           - Prior to the procedure, a History and Physical                            was performed, and patient medications and                            allergies were reviewed. The patient's tolerance of                            previous anesthesia was also reviewed. The risks                            and benefits of the procedure and the sedation                            options and risks were discussed with the patient.                            All questions were answered, and informed consent                            was obtained. Prior Anticoagulants: The patient has                            taken no anticoagulant or antiplatelet agents. ASA                            Grade Assessment: II - A patient with mild systemic                            disease. After reviewing the risks and benefits,                            the patient was deemed in satisfactory condition to                            undergo the procedure.  After obtaining informed consent, the endoscope was                            passed under direct vision. Throughout the                            procedure, the patient's blood pressure, pulse, and                            oxygen saturations were monitored continuously. The                            Olympus scope [id number] was introduced through the                            mouth, and advanced to the second  part of duodenum.                            The upper GI endoscopy was accomplished without                            difficulty. The patient tolerated the procedure                            well. Scope In: Scope Out: Findings:                 The examined esophagus was normal.                           Two 5 to 7 mm sessile polyps with no bleeding and                            no stigmata of recent bleeding were found in the                            gastric fundus. The polyps were removed with a cold                            snare. Resection and retrieval were complete.                           Two 3 mm angioectasias with no bleeding were found                            in the gastric body.                           Patchy mildly erythematous mucosa without bleeding                            was found in the gastric body. Biopsies were taken  with a cold forceps for histology.                           A small hiatal hernia was present.                           The exam of the stomach was otherwise normal.                           A single 3 mm angioectasia without bleeding was                            found in the second portion of the duodenum.                           The exam of the duodenum was otherwise normal.                            Biopsies were taken with a cold forceps for                            histology. Complications:            No immediate complications. Estimated Blood Loss:     Estimated blood loss was minimal. Impression:               - Normal esophagus.                           - Two gastric polyps. Resected and retrieved.                           - Two non-bleeding angioectasias in the stomach.                           - Erythematous mucosa in the gastric body. Biopsied.                           - Small hiatal hernia.                           - A single non-bleeding angioectasia in the                             duodenum. Recommendation:           - Patient has a contact number available for                            emergencies. The signs and symptoms of potential                            delayed complications were discussed with the                            patient. Return to normal activities tomorrow.  Written discharge instructions were provided to the                            patient.                           - Resume previous diet.                           - Continue present medications.                           - Await pathology results.                           - IDA from gastric, duodenal, colonic AVMs.                           - Replace Fe and if IDA is refractory consider                            colonscopy & EGD with APC ablation.                           - Follow up with PCP for IDA mgmt. Meryl Dare, MD 05/22/2023 3:11:16 PM This report has been signed electronically.

## 2023-05-22 NOTE — Progress Notes (Signed)
History & Physical  Primary Care Physician:  Pincus Sanes, MD Primary Gastroenterologist: Claudette Head, MD  Impression / Plan:  Iron deficiency anemia, personal history of adenomatous colon polyps, GERD for colonoscopy and EGD.  CHIEF COMPLAINT:  IDA, GERD, Personal history of colon polyps   HPI: Jeffrey [name] is a 62 y.o. male Iron deficiency anemia, personal history of adenomatous colon polyps, GERD for colonoscopy and EGD.    Past Medical History:  Diagnosis Date   Acute CVA (cerebrovascular accident) [facility])    Allergy    Colonic polyp    adenomatous   Esophageal stricture    GERD (gastroesophageal reflux disease)    Hemorrhage, anal or rectal    Hiatal hernia    Hyperlipidemia    Reflux esophagitis     Past Surgical History:  Procedure Laterality Date   COLONOSCOPY     LIPOMA EXCISION     polyectomy  2008 & 2014   POLYPECTOMY     UPPER GASTROINTESTINAL ENDOSCOPY      Prior to Admission medications   Medication Sig Start Date End Date Taking? Authorizing Provider  aspirin EC 81 MG EC tablet Take 1 tablet (81 mg total) by mouth daily. 10/25/19  Yes Black, Lesle Chris, NP  atorvastatin (LIPITOR) 40 MG tablet TAKE 1 TABLET DAILY AT 6 P.M. 10/26/22  Yes Burns, Bobette Mo, MD  Cyanocobalamin (VITAMIN B 12) 500 MCG TABS Take 500 mcg by mouth daily. 12/01/22  Yes [provider]  omeprazole (PRILOSEC) 20 MG capsule Take 1 capsule (20 mg total) by mouth daily. 10/26/22  Yes Burns, Bobette Mo, MD  ferrous sulfate 325 (65 FE) MG tablet Take 1 tablet (325 mg total) by mouth daily. 10/28/22   Ernie Avena, MD  tadalafil (CIALIS) 20 MG tablet Take 0.5-1 tablets (10-20 mg total) by mouth every other day as needed for erectile dysfunction. 01/08/23   Pincus Sanes, MD    Current Outpatient Medications  Medication Sig Dispense Refill   aspirin EC 81 MG EC tablet Take 1 tablet (81 mg total) by mouth daily.     atorvastatin (LIPITOR) 40 MG tablet TAKE 1 TABLET DAILY AT 6 P.M.  90 tablet 3   Cyanocobalamin (VITAMIN B 12) 500 MCG TABS Take 500 mcg by mouth daily.     omeprazole (PRILOSEC) 20 MG capsule Take 1 capsule (20 mg total) by mouth daily. 90 capsule 3   ferrous sulfate 325 (65 FE) MG tablet Take 1 tablet (325 mg total) by mouth daily. 30 tablet 0   tadalafil (CIALIS) 20 MG tablet Take 0.5-1 tablets (10-20 mg total) by mouth every other day as needed for erectile dysfunction. 18 tablet 5   Current Facility-Administered Medications  Medication Dose Route Frequency Provider Last Rate Last Admin   0.9 %  sodium chloride infusion  500 mL Intravenous Once Meryl Dare, MD        Allergies as of 05/22/2023   (No Known Allergies)    Family History  Problem Relation Age of Onset   Diabetes Mother    Heart disease Father    Diabetes Brother    Heart disease Brother    Colon cancer Neg Hx    Stomach cancer Neg Hx    Esophageal cancer Neg Hx    Rectal cancer Neg Hx    Liver cancer Neg Hx    Pancreatic cancer Neg Hx     Social History   Socioeconomic History   Marital status: Married  Spouse name: Not on file   Number of children: Not on file   Years of education: Not on file   Highest education level: Not on file  Occupational History   Not on file  Tobacco Use   Smoking status: Never   Smokeless tobacco: Current    Types: Snuff   Tobacco comments:    Sometimes snuff   Vaping Use   Vaping status: Never Used  Substance and Sexual Activity   Alcohol use: Yes    Comment: rarely   Drug use: No   Sexual activity: Yes  Other Topics Concern   Not on file  Social History Narrative   [employer] softball a lot      No regular exercise   Social Determinants of Health   Financial Resource Strain: Not on file  Food Insecurity: Not on file  Transportation Needs: Not on file  Physical Activity: Not on file  Stress: Not on file  Social Connections: Not on file  Intimate Partner Violence: Not on file    Review of Systems:  All systems  reviewed were negative except where noted in HPI.   Physical Exam:  General:  Alert, well-developed, in NAD Head:  Normocephalic and atraumatic. Eyes:  Sclera clear, no icterus.   Conjunctiva pink. Ears:  Normal auditory acuity. Mouth:  No deformity or lesions.  Neck:  Supple; no masses. Lungs:  Clear throughout to auscultation.   No wheezes, crackles, or rhonchi.  Heart:  Regular rate and rhythm; no murmurs. Abdomen:  Soft, nondistended, nontender. No masses, hepatomegaly. No palpable masses.  Normal bowel sounds.    Rectal:  Deferred   Msk:  Symmetrical without gross deformities. Extremities:  Without edema. Neurologic:  Alert and  oriented x 4; grossly normal neurologically. Skin:  Intact without significant lesions or rashes. Psych:  Alert and cooperative. Normal mood and affect.    Venita Lick. Russella Dar  05/22/2023, 2:24 PM See Loretha Stapler, [facility], to contact our on call provider

## 2023-05-22 NOTE — Progress Notes (Signed)
Called to room to assist during endoscopic procedure.  Patient ID and intended procedure confirmed with present staff. Received instructions for my participation in the procedure from the performing physician.  

## 2023-05-22 NOTE — Progress Notes (Signed)
Sedate, gd SR, tolerated procedure well, VSS, report to RN 

## 2023-05-22 NOTE — Patient Instructions (Signed)
Repeat colonoscopy in 10 years for surveillance.  Continue present medications.  Resume previous diet.  Await pathology results.  Replace iron- follow up with PCP for management.  YOU HAD AN ENDOSCOPIC PROCEDURE TODAY AT THE [facility]:   Refer to the procedure report that was given to you for any specific questions about what was found during the examination.  If the procedure report does not answer your questions, please call your gastroenterologist to clarify.  If you requested that your care partner not be given the details of your procedure findings, then the procedure report has been included in a sealed envelope for you to review at your convenience later.  YOU SHOULD EXPECT: Some feelings of bloating in the abdomen. Passage of more gas than usual.  Walking can help get rid of the air that was put into your GI tract during the procedure and reduce the bloating. If you had a lower endoscopy (such as a colonoscopy or flexible sigmoidoscopy) you may notice spotting of blood in your stool or on the toilet paper. If you underwent a bowel prep for your procedure, you may not have a normal bowel movement for a few days.  Please Note:  You might notice some irritation and congestion in your nose or some drainage.  This is from the oxygen used during your procedure.  There is no need for concern and it should clear up in a day or so.  SYMPTOMS TO REPORT IMMEDIATELY:  Following lower endoscopy (colonoscopy or flexible sigmoidoscopy):  Excessive amounts of blood in the stool  Significant tenderness or worsening of abdominal pains  Swelling of the abdomen that is new, acute  Fever of 100F or higher  Following upper endoscopy (EGD)  Vomiting of blood or coffee ground material  New chest pain or pain under the shoulder blades  Painful or persistently difficult swallowing  New shortness of breath  Fever of 100F or higher  Black, tarry-looking stools  For urgent or emergent  issues, a gastroenterologist can be reached at any hour by calling [phone]. Do not use MyChart messaging for urgent concerns.    DIET:  We do recommend a small meal at first, but then you may proceed to your regular diet.  Drink plenty of fluids but you should avoid alcoholic beverages for 24 hours.  ACTIVITY:  You should plan to take it easy for the rest of today and you should NOT DRIVE or use heavy machinery until tomorrow (because of the sedation medicines used during the test).    FOLLOW UP: Our staff will call the number listed on your records the next business day following your procedure.  We will call around 7:15- 8:00 am to check on you and address any questions or concerns that you may have regarding the information given to you following your procedure. If we do not reach you, we will leave a message.     If any biopsies were taken you will be contacted by phone or by letter within the next 1-3 weeks.  Please call us at [phone] if you have not heard about the biopsies in 3 weeks.    SIGNATURES/CONFIDENTIALITY: You and/or your care partner have signed paperwork which will be entered into your electronic medical record.  These signatures attest to the fact that that the information above on your After Visit Summary has been reviewed and is understood.  Full responsibility of the confidentiality of this discharge information lies with you and/or your care-partner.

## 2023-05-22 NOTE — Op Note (Signed)
[facility] Patient Name: Jeffrey [name] Procedure Date: 05/22/2023 2:27 PM MRN: [id number] Endoscopist: Meryl Dare , MD, [phone] Age: 62 Referring MD:  Date of Birth: 03-12-1961 Gender: Male Account #: [id number] Procedure:                Colonoscopy Indications:              Iron deficiency anemia Medicines:                Monitored Anesthesia Care Procedure:                Pre-Anesthesia Assessment:                           - Prior to the procedure, a History and Physical                            was performed, and patient medications and                            allergies were reviewed. The patient's tolerance of                            previous anesthesia was also reviewed. The risks                            and benefits of the procedure and the sedation                            options and risks were discussed with the patient.                            All questions were answered, and informed consent                            was obtained. Prior Anticoagulants: The patient has                            taken no anticoagulant or antiplatelet agents. ASA                            Grade Assessment: II - A patient with mild systemic                            disease. After reviewing the risks and benefits,                            the patient was deemed in satisfactory condition to                            undergo the procedure.                           After obtaining informed consent, the colonoscope  was passed under direct vision. Throughout the                            procedure, the patient's blood pressure, pulse, and                            oxygen saturations were monitored continuously. The                            CF [id number] #[id number] was introduced through the anus                            and advanced to the the cecum, identified by                            appendiceal orifice and ileocecal  valve. The                            ileocecal valve, appendiceal orifice, and rectum                            were photographed. The quality of the bowel                            preparation was good. The colonoscopy was performed                            without difficulty. The patient tolerated the                            procedure well. Scope In: 2:35:49 PM Scope Out: 2:47:14 PM Scope Withdrawal Time: 0 hours 9 minutes 34 seconds  Total Procedure Duration: 0 hours 11 minutes 25 seconds  Findings:                 The perianal and digital rectal examinations were                            normal.                           A single medium-sized localized angioectasia                            without bleeding was found in the cecum.                           Internal hemorrhoids were found during                            retroflexion. The hemorrhoids were moderate and                            Grade I (internal hemorrhoids that do not prolapse).  The exam was otherwise without abnormality on                            direct and retroflexion views. Complications:            No immediate complications. Estimated blood loss:                            None. Estimated Blood Loss:     Estimated blood loss: none. Impression:               - A single non-bleeding colonic angioectasia.                           - Internal hemorrhoids.                           - The examination was otherwise normal on direct                            and retroflexion views.                           - No specimens collected. Recommendation:           - Repeat colonoscopy in 10 years for surveillance.                           - Patient has a contact number available for                            emergencies. The signs and symptoms of potential                            delayed complications were discussed with the                            patient. Return to normal  activities tomorrow.                            Written discharge instructions were provided to the                            patient.                           - Resume previous diet.                           - Continue present medications. Meryl Dare, MD 05/22/2023 3:04:40 PM This report has been signed electronically.

## 2023-05-23 ENCOUNTER — Telehealth: Payer: Self-pay | Admitting: *Deleted

## 2023-05-23 NOTE — Telephone Encounter (Signed)
Left message on f/u call 

## 2023-05-24 NOTE — Telephone Encounter (Addendum)
Returned pts call.  He states that he has some soreness to touch in the area where his jaw meets the ear.  It is slightly swollen but he not experiencing difficulty swallowing or breathing.  I explained to him that this could be a result of pressure given during the procedure to lift the jaw.  Spoke with T. Blocker who was his CRNA during the procedure. Marland Kitchen  He was able to talk to the patient and feels that the swelling could be from a swollen lymph node because the patient states that he is coming down with a cold.  Patient is to call us back should he have any worsening  or new symptoms.

## 2023-05-24 NOTE — Telephone Encounter (Signed)
PT is having some swelling on left side of neck close to the ear and very concern after having EGD please advise

## 2023-05-31 LAB — SURGICAL PATHOLOGY

## 2023-06-12 ENCOUNTER — Encounter: Payer: Self-pay | Admitting: Gastroenterology

## 2023-06-29 ENCOUNTER — Encounter: Payer: Self-pay | Admitting: Internal Medicine

## 2023-08-13 DIAGNOSIS — D2261 Melanocytic nevi of right upper limb, including shoulder: Secondary | ICD-10-CM | POA: Diagnosis not present

## 2023-08-13 DIAGNOSIS — D225 Melanocytic nevi of trunk: Secondary | ICD-10-CM | POA: Diagnosis not present

## 2023-08-13 DIAGNOSIS — D224 Melanocytic nevi of scalp and neck: Secondary | ICD-10-CM | POA: Diagnosis not present

## 2023-08-13 DIAGNOSIS — L718 Other rosacea: Secondary | ICD-10-CM | POA: Diagnosis not present

## 2023-08-13 DIAGNOSIS — L738 Other specified follicular disorders: Secondary | ICD-10-CM | POA: Diagnosis not present

## 2023-11-09 ENCOUNTER — Encounter: Payer: Self-pay | Admitting: Internal Medicine

## 2023-11-12 ENCOUNTER — Other Ambulatory Visit: Payer: Self-pay

## 2023-11-12 MED ORDER — ATORVASTATIN CALCIUM 40 MG PO TABS: ORAL_TABLET | ORAL | 3 refills | Status: AC

## 2023-11-14 ENCOUNTER — Telehealth: Payer: Self-pay

## 2023-11-14 ENCOUNTER — Other Ambulatory Visit (HOSPITAL_COMMUNITY): Payer: Self-pay

## 2023-11-14 NOTE — Telephone Encounter (Signed)
 Pharmacy Patient Advocate Encounter   Received notification from Patient Advice Request messages that prior authorization for Omeprazole 20MG  dr capsules is required/requested.   Insurance verification completed.   The patient is insured through [facility] .   Per test claim: PA required; PA submitted to above mentioned insurance via CoverMyMeds Key/confirmation #/EOC [id number] Status is pending

## 2023-11-14 NOTE — Telephone Encounter (Signed)
 Pharmacy Patient Advocate Encounter  Received notification from [employer] that Prior Authorization for Omeprazole 20MG  dr capsules has been APPROVED from 11/13/23 to 11/12/24. Ran test claim, Copay is $0. This test claim was processed through [facility]- copay amounts may vary at other pharmacies due to pharmacy/plan contracts, or as the patient moves through the different stages of their insurance plan.   PA #/Case ID/Reference #: [id number]

## 2023-11-26 ENCOUNTER — Encounter: Payer: Self-pay | Admitting: Internal Medicine

## 2023-11-29 ENCOUNTER — Other Ambulatory Visit: Payer: Self-pay | Admitting: Internal Medicine

## 2023-11-30 MED ORDER — OMEPRAZOLE 20 MG PO CPDR: 20.0000 mg | DELAYED_RELEASE_CAPSULE | Freq: Every day | ORAL | 0 refills | Status: DC

## 2023-12-19 ENCOUNTER — Encounter: Payer: Self-pay | Admitting: Internal Medicine

## 2024-01-08 NOTE — Progress Notes (Unsigned)
    Subjective:    Patient ID: Jeffrey [name], male    DOB: 1960/09/22, 63 y.o.   MRN: [id number]     HPI Jeffrey [name] is here for a physical exam and his chronic medical problems.      Medications and allergies reviewed with patient and updated if appropriate.  Current Outpatient Medications on File Prior to Visit  Medication Sig Dispense Refill   aspirin  EC 81 MG EC tablet Take 1 tablet (81 mg total) by mouth daily.     atorvastatin  (LIPITOR) 40 MG tablet TAKE 1 TABLET DAILY AT 6 P.M. 90 tablet 3   Cyanocobalamin  (VITAMIN B 12) 500 MCG TABS Take 500 mcg by mouth daily.     ferrous sulfate  325 (65 FE) MG tablet Take 1 tablet (325 mg total) by mouth daily. 30 tablet 0   omeprazole  (PRILOSEC) 20 MG capsule Take 1 capsule (20 mg total) by mouth daily. 90 capsule 0   tadalafil  (CIALIS ) 20 MG tablet Take 0.5-1 tablets (10-20 mg total) by mouth every other day as needed for erectile dysfunction. 18 tablet 5   No current facility-administered medications on file prior to visit.    Review of Systems     Objective:  There were no vitals filed for this visit. There were no vitals filed for this visit. There is no height or weight on file to calculate BMI.  BP Readings from Last 3 Encounters:  05/22/23 117/77  04/10/23 110/82  12/28/22 (!) 156/106    Wt Readings from Last 3 Encounters:  05/22/23 153 lb (69.4 kg)  04/10/23 153 lb 8 oz (69.6 kg)  12/28/22 154 lb (69.9 kg)      Physical Exam Constitutional: He appears well-developed and well-nourished. No distress.  HENT:  Head: Normocephalic and atraumatic.  Right Ear: External ear normal.  Left Ear: External ear normal.  Normal ear canals and TM b/l  Mouth/Throat: Oropharynx is clear and moist. Eyes: Conjunctivae and EOM are normal.  Neck: Neck supple. No tracheal deviation present. No thyromegaly present.  No carotid bruit  Cardiovascular: Normal rate, regular rhythm, normal heart sounds and intact distal pulses.   No  murmur heard.  No lower extremity edema. Pulmonary/Chest: Effort normal and breath sounds normal. No respiratory distress. He has no wheezes. He has no rales.  Abdominal: Soft. He exhibits no distension. There is no tenderness.  Genitourinary: deferred  Lymphadenopathy:   He has no cervical adenopathy.  Skin: Skin is warm and dry. He is not diaphoretic.  Psychiatric: He has a normal mood and affect. His behavior is normal.         Assessment & Plan:   Physical exam: Screening blood work  ordered Exercise    Weight   Substance abuse   none   Reviewed recommended immunizations.   Health Maintenance  Topic Date Due   INFLUENZA VACCINE  04/11/2024   DTaP/Tdap/Td (2 - Td or Tdap) 06/30/2026   Colonoscopy  05/21/2033   COVID-19 Vaccine  Completed   Hepatitis C Screening  Completed   HIV Screening  Completed   Zoster Vaccines- Shingrix   Completed   HPV VACCINES  Aged Out   Meningococcal B Vaccine  Aged Out     See Problem List for Assessment and Plan of chronic medical problems.

## 2024-01-08 NOTE — Patient Instructions (Addendum)
 Blood work was ordered.       Medications changes include :   None    A referral was ordered and someone will call you to schedule an appointment.     Return in about 1 year (around 01/08/2025) for Physical Exam.   Health Maintenance, Male Adopting a healthy lifestyle and getting preventive care are important in promoting health and wellness. Ask your health care provider about: The right schedule for you to have regular tests and exams. Things you can do on your own to prevent diseases and keep yourself healthy. What should I know about diet, weight, and exercise? Eat a healthy diet  Eat a diet that includes plenty of vegetables, fruits, low-fat dairy products, and lean protein. Do not eat a lot of foods that are high in solid fats, added sugars, or sodium. Maintain a healthy weight Body mass index (BMI) is a measurement that can be used to identify possible weight problems. It estimates body fat based on height and weight. Your health care provider can help determine your BMI and help you achieve or maintain a healthy weight. Get regular exercise Get regular exercise. This is one of the most important things you can do for your health. Most adults should: Exercise for at least 150 minutes each week. The exercise should increase your heart rate and make you sweat (moderate-intensity exercise). Do strengthening exercises at least twice a week. This is in addition to the moderate-intensity exercise. Spend less time sitting. Even light physical activity can be beneficial. Watch cholesterol and blood lipids Have your blood tested for lipids and cholesterol at 62 years of age, then have this test every 5 years. You may need to have your cholesterol levels checked more often if: Your lipid or cholesterol levels are high. You are older than 62 years of age. You are at high risk for heart disease. What should I know about cancer screening? Many types of cancers can be detected  early and may often be prevented. Depending on your health history and family history, you may need to have cancer screening at various ages. This may include screening for: Colorectal cancer. Prostate cancer. Skin cancer. Lung cancer. What should I know about heart disease, diabetes, and high blood pressure? Blood pressure and heart disease High blood pressure causes heart disease and increases the risk of stroke. This is more likely to develop in people who have high blood pressure readings or are overweight. Talk with your health care provider about your target blood pressure readings. Have your blood pressure checked: Every 3-5 years if you are 72-41 years of age. Every year if you are 39 years old or older. If you are between the ages of 95 and 107 and are a current or former smoker, ask your health care provider if you should have a one-time screening for abdominal aortic aneurysm (AAA). Diabetes Have regular diabetes screenings. This checks your fasting blood sugar level. Have the screening done: Once every three years after age 61 if you are at a normal weight and have a low risk for diabetes. More often and at a younger age if you are overweight or have a high risk for diabetes. What should I know about preventing infection? Hepatitis B If you have a higher risk for hepatitis B, you should be screened for this virus. Talk with your health care provider to find out if you are at risk for hepatitis B infection. Hepatitis C Blood testing is recommended for:  Everyone born from [birth year] through 1964. Anyone with known risk factors for hepatitis C. Sexually transmitted infections (STIs) You should be screened each year for STIs, including gonorrhea and chlamydia, if: You are sexually active and are younger than 62 years of age. You are older than 62 years of age and your health care provider tells you that you are at risk for this type of infection. Your sexual activity has changed since  you were last screened, and you are at increased risk for chlamydia or gonorrhea. Ask your health care provider if you are at risk. Ask your health care provider about whether you are at high risk for HIV. Your health care provider may recommend a prescription medicine to help prevent HIV infection. If you choose to take medicine to prevent HIV, you should first get tested for HIV. You should then be tested every 3 months for as long as you are taking the medicine. Follow these instructions at home: Alcohol use Do not drink alcohol if your health care provider tells you not to drink. If you drink alcohol: Limit how much you have to 0-2 drinks a day. Know how much alcohol is in your drink. In the [place] one drink equals one 12 oz bottle of beer (355 mL), one 5 oz glass of wine (148 mL), or one 1 oz glass of hard liquor (44 mL). Lifestyle Do not use any products that contain nicotine or tobacco. These products include cigarettes, chewing tobacco, and vaping devices, such as e-cigarettes. If you need help quitting, ask your health care provider. Do not use street drugs. Do not share needles. Ask your health care provider for help if you need support or information about quitting drugs. General instructions Schedule regular health, dental, and eye exams. Stay current with your vaccines. Tell your health care provider if: You often feel depressed. You have ever been abused or do not feel safe at home. Summary Adopting a healthy lifestyle and getting preventive care are important in promoting health and wellness. Follow your health care provider's instructions about healthy diet, exercising, and getting tested or screened for diseases. Follow your health care provider's instructions on monitoring your cholesterol and blood pressure. This information is not intended to replace advice given to you by your health care provider. Make sure you discuss any questions you have with your health care  provider. Document Revised: 01/17/2021 Document Reviewed: 01/17/2021 Elsevier Patient Education  2024 [employer].

## 2024-01-09 ENCOUNTER — Ambulatory Visit (INDEPENDENT_AMBULATORY_CARE_PROVIDER_SITE_OTHER): Admitting: Internal Medicine

## 2024-01-09 ENCOUNTER — Encounter: Payer: Self-pay | Admitting: Internal Medicine

## 2024-01-09 VITALS — BP 116/72 | HR 60 | Temp 98.4°F | Ht 68.0 in | Wt 148.6 lb

## 2024-01-09 DIAGNOSIS — K219 Gastro-esophageal reflux disease without esophagitis: Secondary | ICD-10-CM

## 2024-01-09 DIAGNOSIS — Z0000 Encounter for general adult medical examination without abnormal findings: Secondary | ICD-10-CM

## 2024-01-09 DIAGNOSIS — Z125 Encounter for screening for malignant neoplasm of prostate: Secondary | ICD-10-CM | POA: Diagnosis not present

## 2024-01-09 DIAGNOSIS — N529 Male erectile dysfunction, unspecified: Secondary | ICD-10-CM | POA: Diagnosis not present

## 2024-01-09 DIAGNOSIS — Z8673 Personal history of transient ischemic attack (TIA), and cerebral infarction without residual deficits: Secondary | ICD-10-CM | POA: Diagnosis not present

## 2024-01-09 LAB — LIPID PANEL
Cholesterol: 115 mg/dL (ref 0–200)
HDL: 46.5 mg/dL (ref >39.00)
LDL Cholesterol: 44 mg/dL (ref 0–99)
NonHDL: 68.02
Total CHOL/HDL Ratio: 2
Triglycerides: 122 mg/dL (ref 0.0–149.0)
VLDL: 24.4 mg/dL (ref 0.0–40.0)

## 2024-01-09 LAB — CBC WITH DIFFERENTIAL/PLATELET
Basophils Absolute: 0 10*3/uL (ref 0.0–0.1)
Basophils Relative: 1 % (ref 0.0–3.0)
Eosinophils Absolute: 0.3 10*3/uL (ref 0.0–0.7)
Eosinophils Relative: 6.8 % — ABNORMAL HIGH (ref 0.0–5.0)
HCT: 41.2 % (ref 39.0–52.0)
Hemoglobin: 14.4 g/dL (ref 13.0–17.0)
Lymphocytes Relative: 34.5 % (ref 12.0–46.0)
Lymphs Abs: 1.6 10*3/uL (ref 0.7–4.0)
MCHC: 35 g/dL (ref 30.0–36.0)
MCV: 94.7 fl (ref 78.0–100.0)
Monocytes Absolute: 0.5 10*3/uL (ref 0.1–1.0)
Monocytes Relative: 11.1 % (ref 3.0–12.0)
Neutro Abs: 2.1 10*3/uL (ref 1.4–7.7)
Neutrophils Relative %: 46.6 % (ref 43.0–77.0)
Platelets: 240 10*3/uL (ref 150.0–400.0)
RBC: 4.35 Mil/uL (ref 4.22–5.81)
RDW: 13.2 % (ref 11.5–15.5)
WBC: 4.6 10*3/uL (ref 4.0–10.5)

## 2024-01-09 LAB — COMPREHENSIVE METABOLIC PANEL WITH GFR
ALT: 15 U/L (ref 0–53)
AST: 20 U/L (ref 0–37)
Albumin: 4 g/dL (ref 3.5–5.2)
Alkaline Phosphatase: 74 U/L (ref 39–117)
BUN: 13 mg/dL (ref 6–23)
CO2: 29 meq/L (ref 19–32)
Calcium: 8.6 mg/dL (ref 8.4–10.5)
Chloride: 103 meq/L (ref 96–112)
Creatinine, Ser: 1.02 mg/dL (ref 0.40–1.50)
GFR: 79.07 mL/min (ref >60.00)
Glucose, Bld: 106 mg/dL — ABNORMAL HIGH (ref 70–99)
Potassium: 4 meq/L (ref 3.5–5.1)
Sodium: 139 meq/L (ref 135–145)
Total Bilirubin: 0.6 mg/dL (ref 0.2–1.2)
Total Protein: 6.7 g/dL (ref 6.0–8.3)

## 2024-01-09 LAB — PSA, MEDICARE: PSA: 0.28 ng/mL (ref 0.10–4.00)

## 2024-01-09 LAB — TSH: TSH: 1.33 u[IU]/mL (ref 0.35–5.50)

## 2024-01-12 ENCOUNTER — Encounter: Payer: Self-pay | Admitting: Internal Medicine

## 2024-02-15 ENCOUNTER — Other Ambulatory Visit: Payer: Self-pay | Admitting: Internal Medicine

## 2024-04-02 DIAGNOSIS — M542 Cervicalgia: Secondary | ICD-10-CM | POA: Diagnosis not present

## 2024-04-02 DIAGNOSIS — M62830 Muscle spasm of back: Secondary | ICD-10-CM | POA: Diagnosis not present

## 2024-04-02 DIAGNOSIS — M9902 Segmental and somatic dysfunction of thoracic region: Secondary | ICD-10-CM | POA: Diagnosis not present

## 2024-04-02 DIAGNOSIS — M9901 Segmental and somatic dysfunction of cervical region: Secondary | ICD-10-CM | POA: Diagnosis not present

## 2024-04-04 DIAGNOSIS — M9902 Segmental and somatic dysfunction of thoracic region: Secondary | ICD-10-CM | POA: Diagnosis not present

## 2024-04-04 DIAGNOSIS — M62830 Muscle spasm of back: Secondary | ICD-10-CM | POA: Diagnosis not present

## 2024-04-04 DIAGNOSIS — M542 Cervicalgia: Secondary | ICD-10-CM | POA: Diagnosis not present

## 2024-04-04 DIAGNOSIS — M9901 Segmental and somatic dysfunction of cervical region: Secondary | ICD-10-CM | POA: Diagnosis not present

## 2024-04-07 DIAGNOSIS — M62830 Muscle spasm of back: Secondary | ICD-10-CM | POA: Diagnosis not present

## 2024-04-07 DIAGNOSIS — M9903 Segmental and somatic dysfunction of lumbar region: Secondary | ICD-10-CM | POA: Diagnosis not present

## 2024-04-07 DIAGNOSIS — M9902 Segmental and somatic dysfunction of thoracic region: Secondary | ICD-10-CM | POA: Diagnosis not present

## 2024-04-07 DIAGNOSIS — M9901 Segmental and somatic dysfunction of cervical region: Secondary | ICD-10-CM | POA: Diagnosis not present

## 2024-04-10 DIAGNOSIS — M62830 Muscle spasm of back: Secondary | ICD-10-CM | POA: Diagnosis not present

## 2024-04-10 DIAGNOSIS — M9901 Segmental and somatic dysfunction of cervical region: Secondary | ICD-10-CM | POA: Diagnosis not present

## 2024-04-10 DIAGNOSIS — M9903 Segmental and somatic dysfunction of lumbar region: Secondary | ICD-10-CM | POA: Diagnosis not present

## 2024-04-10 DIAGNOSIS — M9902 Segmental and somatic dysfunction of thoracic region: Secondary | ICD-10-CM | POA: Diagnosis not present

## 2024-04-11 DIAGNOSIS — M62830 Muscle spasm of back: Secondary | ICD-10-CM | POA: Diagnosis not present

## 2024-04-11 DIAGNOSIS — M9901 Segmental and somatic dysfunction of cervical region: Secondary | ICD-10-CM | POA: Diagnosis not present

## 2024-04-11 DIAGNOSIS — M9902 Segmental and somatic dysfunction of thoracic region: Secondary | ICD-10-CM | POA: Diagnosis not present

## 2024-04-11 DIAGNOSIS — M542 Cervicalgia: Secondary | ICD-10-CM | POA: Diagnosis not present

## 2024-04-14 DIAGNOSIS — M62830 Muscle spasm of back: Secondary | ICD-10-CM | POA: Diagnosis not present

## 2024-04-14 DIAGNOSIS — M9903 Segmental and somatic dysfunction of lumbar region: Secondary | ICD-10-CM | POA: Diagnosis not present

## 2024-04-14 DIAGNOSIS — M9901 Segmental and somatic dysfunction of cervical region: Secondary | ICD-10-CM | POA: Diagnosis not present

## 2024-04-14 DIAGNOSIS — M9902 Segmental and somatic dysfunction of thoracic region: Secondary | ICD-10-CM | POA: Diagnosis not present

## 2024-04-17 DIAGNOSIS — M9902 Segmental and somatic dysfunction of thoracic region: Secondary | ICD-10-CM | POA: Diagnosis not present

## 2024-04-17 DIAGNOSIS — M9901 Segmental and somatic dysfunction of cervical region: Secondary | ICD-10-CM | POA: Diagnosis not present

## 2024-04-17 DIAGNOSIS — M62830 Muscle spasm of back: Secondary | ICD-10-CM | POA: Diagnosis not present

## 2024-04-17 DIAGNOSIS — M9903 Segmental and somatic dysfunction of lumbar region: Secondary | ICD-10-CM | POA: Diagnosis not present

## 2024-04-18 DIAGNOSIS — M542 Cervicalgia: Secondary | ICD-10-CM | POA: Diagnosis not present

## 2024-04-18 DIAGNOSIS — M9902 Segmental and somatic dysfunction of thoracic region: Secondary | ICD-10-CM | POA: Diagnosis not present

## 2024-04-18 DIAGNOSIS — M9901 Segmental and somatic dysfunction of cervical region: Secondary | ICD-10-CM | POA: Diagnosis not present

## 2024-04-18 DIAGNOSIS — M62830 Muscle spasm of back: Secondary | ICD-10-CM | POA: Diagnosis not present

## 2024-04-21 DIAGNOSIS — M9901 Segmental and somatic dysfunction of cervical region: Secondary | ICD-10-CM | POA: Diagnosis not present

## 2024-04-21 DIAGNOSIS — M62830 Muscle spasm of back: Secondary | ICD-10-CM | POA: Diagnosis not present

## 2024-04-21 DIAGNOSIS — M9902 Segmental and somatic dysfunction of thoracic region: Secondary | ICD-10-CM | POA: Diagnosis not present

## 2024-04-21 DIAGNOSIS — M9903 Segmental and somatic dysfunction of lumbar region: Secondary | ICD-10-CM | POA: Diagnosis not present

## 2024-04-24 DIAGNOSIS — M9903 Segmental and somatic dysfunction of lumbar region: Secondary | ICD-10-CM | POA: Diagnosis not present

## 2024-04-24 DIAGNOSIS — M9901 Segmental and somatic dysfunction of cervical region: Secondary | ICD-10-CM | POA: Diagnosis not present

## 2024-04-24 DIAGNOSIS — M9902 Segmental and somatic dysfunction of thoracic region: Secondary | ICD-10-CM | POA: Diagnosis not present

## 2024-04-24 DIAGNOSIS — M62830 Muscle spasm of back: Secondary | ICD-10-CM | POA: Diagnosis not present

## 2024-04-28 DIAGNOSIS — M9901 Segmental and somatic dysfunction of cervical region: Secondary | ICD-10-CM | POA: Diagnosis not present

## 2024-04-28 DIAGNOSIS — M9902 Segmental and somatic dysfunction of thoracic region: Secondary | ICD-10-CM | POA: Diagnosis not present

## 2024-04-28 DIAGNOSIS — M62830 Muscle spasm of back: Secondary | ICD-10-CM | POA: Diagnosis not present

## 2024-04-28 DIAGNOSIS — M9903 Segmental and somatic dysfunction of lumbar region: Secondary | ICD-10-CM | POA: Diagnosis not present

## 2024-05-01 DIAGNOSIS — M9903 Segmental and somatic dysfunction of lumbar region: Secondary | ICD-10-CM | POA: Diagnosis not present

## 2024-05-01 DIAGNOSIS — M9902 Segmental and somatic dysfunction of thoracic region: Secondary | ICD-10-CM | POA: Diagnosis not present

## 2024-05-01 DIAGNOSIS — M9901 Segmental and somatic dysfunction of cervical region: Secondary | ICD-10-CM | POA: Diagnosis not present

## 2024-05-01 DIAGNOSIS — M62830 Muscle spasm of back: Secondary | ICD-10-CM | POA: Diagnosis not present

## 2024-05-05 DIAGNOSIS — M9903 Segmental and somatic dysfunction of lumbar region: Secondary | ICD-10-CM | POA: Diagnosis not present

## 2024-05-05 DIAGNOSIS — M9902 Segmental and somatic dysfunction of thoracic region: Secondary | ICD-10-CM | POA: Diagnosis not present

## 2024-05-05 DIAGNOSIS — M9901 Segmental and somatic dysfunction of cervical region: Secondary | ICD-10-CM | POA: Diagnosis not present

## 2024-05-05 DIAGNOSIS — M62830 Muscle spasm of back: Secondary | ICD-10-CM | POA: Diagnosis not present

## 2024-05-15 DIAGNOSIS — M9903 Segmental and somatic dysfunction of lumbar region: Secondary | ICD-10-CM | POA: Diagnosis not present

## 2024-05-15 DIAGNOSIS — M9901 Segmental and somatic dysfunction of cervical region: Secondary | ICD-10-CM | POA: Diagnosis not present

## 2024-05-15 DIAGNOSIS — M62830 Muscle spasm of back: Secondary | ICD-10-CM | POA: Diagnosis not present

## 2024-05-15 DIAGNOSIS — M9902 Segmental and somatic dysfunction of thoracic region: Secondary | ICD-10-CM | POA: Diagnosis not present

## 2024-05-19 DIAGNOSIS — M9903 Segmental and somatic dysfunction of lumbar region: Secondary | ICD-10-CM | POA: Diagnosis not present

## 2024-05-19 DIAGNOSIS — M62830 Muscle spasm of back: Secondary | ICD-10-CM | POA: Diagnosis not present

## 2024-05-19 DIAGNOSIS — M9901 Segmental and somatic dysfunction of cervical region: Secondary | ICD-10-CM | POA: Diagnosis not present

## 2024-05-19 DIAGNOSIS — M9902 Segmental and somatic dysfunction of thoracic region: Secondary | ICD-10-CM | POA: Diagnosis not present

## 2024-05-22 DIAGNOSIS — M62830 Muscle spasm of back: Secondary | ICD-10-CM | POA: Diagnosis not present

## 2024-05-22 DIAGNOSIS — M9901 Segmental and somatic dysfunction of cervical region: Secondary | ICD-10-CM | POA: Diagnosis not present

## 2024-05-22 DIAGNOSIS — M9903 Segmental and somatic dysfunction of lumbar region: Secondary | ICD-10-CM | POA: Diagnosis not present

## 2024-05-22 DIAGNOSIS — M9902 Segmental and somatic dysfunction of thoracic region: Secondary | ICD-10-CM | POA: Diagnosis not present

## 2024-05-23 DIAGNOSIS — M62830 Muscle spasm of back: Secondary | ICD-10-CM | POA: Diagnosis not present

## 2024-05-23 DIAGNOSIS — M9901 Segmental and somatic dysfunction of cervical region: Secondary | ICD-10-CM | POA: Diagnosis not present

## 2024-05-23 DIAGNOSIS — M9902 Segmental and somatic dysfunction of thoracic region: Secondary | ICD-10-CM | POA: Diagnosis not present

## 2024-05-23 DIAGNOSIS — M542 Cervicalgia: Secondary | ICD-10-CM | POA: Diagnosis not present

## 2024-05-26 DIAGNOSIS — M9901 Segmental and somatic dysfunction of cervical region: Secondary | ICD-10-CM | POA: Diagnosis not present

## 2024-05-26 DIAGNOSIS — M62830 Muscle spasm of back: Secondary | ICD-10-CM | POA: Diagnosis not present

## 2024-05-26 DIAGNOSIS — M9902 Segmental and somatic dysfunction of thoracic region: Secondary | ICD-10-CM | POA: Diagnosis not present

## 2024-05-26 DIAGNOSIS — M9903 Segmental and somatic dysfunction of lumbar region: Secondary | ICD-10-CM | POA: Diagnosis not present

## 2024-05-29 DIAGNOSIS — M9903 Segmental and somatic dysfunction of lumbar region: Secondary | ICD-10-CM | POA: Diagnosis not present

## 2024-05-29 DIAGNOSIS — M62830 Muscle spasm of back: Secondary | ICD-10-CM | POA: Diagnosis not present

## 2024-05-29 DIAGNOSIS — M9901 Segmental and somatic dysfunction of cervical region: Secondary | ICD-10-CM | POA: Diagnosis not present

## 2024-05-29 DIAGNOSIS — M9902 Segmental and somatic dysfunction of thoracic region: Secondary | ICD-10-CM | POA: Diagnosis not present

## 2024-06-02 DIAGNOSIS — M9902 Segmental and somatic dysfunction of thoracic region: Secondary | ICD-10-CM | POA: Diagnosis not present

## 2024-06-02 DIAGNOSIS — M9901 Segmental and somatic dysfunction of cervical region: Secondary | ICD-10-CM | POA: Diagnosis not present

## 2024-06-02 DIAGNOSIS — M9903 Segmental and somatic dysfunction of lumbar region: Secondary | ICD-10-CM | POA: Diagnosis not present

## 2024-06-02 DIAGNOSIS — M62830 Muscle spasm of back: Secondary | ICD-10-CM | POA: Diagnosis not present

## 2024-06-05 DIAGNOSIS — M9903 Segmental and somatic dysfunction of lumbar region: Secondary | ICD-10-CM | POA: Diagnosis not present

## 2024-06-05 DIAGNOSIS — M62830 Muscle spasm of back: Secondary | ICD-10-CM | POA: Diagnosis not present

## 2024-06-05 DIAGNOSIS — M9902 Segmental and somatic dysfunction of thoracic region: Secondary | ICD-10-CM | POA: Diagnosis not present

## 2024-06-05 DIAGNOSIS — M9901 Segmental and somatic dysfunction of cervical region: Secondary | ICD-10-CM | POA: Diagnosis not present

## 2024-06-11 ENCOUNTER — Other Ambulatory Visit: Payer: Self-pay | Admitting: Internal Medicine

## 2024-06-12 DIAGNOSIS — M9903 Segmental and somatic dysfunction of lumbar region: Secondary | ICD-10-CM | POA: Diagnosis not present

## 2024-06-12 DIAGNOSIS — M62830 Muscle spasm of back: Secondary | ICD-10-CM | POA: Diagnosis not present

## 2024-06-12 DIAGNOSIS — M9902 Segmental and somatic dysfunction of thoracic region: Secondary | ICD-10-CM | POA: Diagnosis not present

## 2024-06-12 DIAGNOSIS — M9901 Segmental and somatic dysfunction of cervical region: Secondary | ICD-10-CM | POA: Diagnosis not present

## 2024-06-16 ENCOUNTER — Ambulatory Visit: Admitting: Family Medicine

## 2024-06-16 ENCOUNTER — Encounter: Payer: Self-pay | Admitting: Family Medicine

## 2024-06-16 VITALS — BP 118/68 | HR 70 | Temp 98.2°F | Resp 18 | Ht 68.0 in | Wt 150.0 lb

## 2024-06-16 DIAGNOSIS — H6122 Impacted cerumen, left ear: Secondary | ICD-10-CM

## 2024-06-16 DIAGNOSIS — M9901 Segmental and somatic dysfunction of cervical region: Secondary | ICD-10-CM | POA: Diagnosis not present

## 2024-06-16 DIAGNOSIS — M9902 Segmental and somatic dysfunction of thoracic region: Secondary | ICD-10-CM | POA: Diagnosis not present

## 2024-06-16 DIAGNOSIS — M62830 Muscle spasm of back: Secondary | ICD-10-CM | POA: Diagnosis not present

## 2024-06-16 DIAGNOSIS — M9903 Segmental and somatic dysfunction of lumbar region: Secondary | ICD-10-CM | POA: Diagnosis not present

## 2024-06-16 NOTE — Progress Notes (Signed)
 Assessment & Plan Impacted cerumen of left ear - Education provided on earwax buildup.  - Successful irrigation of left ear canal. Patient reports hearing is back to normal.      Follow up plan: Return if symptoms worsen or fail to improve.  Niki Rung, MSN, APRN, FNP-C  Subjective:  HPI: Jeffrey [name] is a 63 y.o. male presenting on 06/16/2024 for Ear Fullness (Left ear feels full, loss of hearing. /)  Discussed the use of AI scribe software for clinical note transcription with the patient, who gave verbal consent to proceed.  He experiences a sudden onset of a clogged sensation in his left ear, describing it as feeling like he 'went deaf overnight'. He notes an echo in his head when he closes his right ear and decreased hearing on the left side compared to the right.  He has attempted self-care measures including cleaning the ear with warm compresses, warm water, and Q-tips, which may have pushed wax deeper into the ear canal. He has not used alcohol or other substances in the ear due to concerns about potential harm.  He is frustrated with the persistent echo and impaired hearing, which prompted him to seek medical attention.      ROS: Negative unless specifically indicated above in HPI.   Relevant past medical history reviewed and updated as indicated.   Allergies and medications reviewed and updated.   Current Outpatient Medications:    aspirin  EC 81 MG EC tablet, Take 1 tablet (81 mg total) by mouth daily., Disp:  , Rfl:    atorvastatin  (LIPITOR) 40 MG tablet, TAKE 1 TABLET DAILY AT 6 P.M., Disp: 90 tablet, Rfl: 3   Cyanocobalamin  (VITAMIN B 12) 500 MCG TABS, Take 500 mcg by mouth daily., Disp: , Rfl:    ferrous sulfate  325 (65 FE) MG tablet, Take 1 tablet (325 mg total) by mouth daily., Disp: 30 tablet, Rfl: 0   omeprazole  (PRILOSEC) 20 MG capsule, TAKE 1 CAPSULE BY MOUTH EVERY DAY, Disp: 30 capsule, Rfl: 2   tadalafil  (CIALIS ) 20 MG tablet, Take 0.5-1 tablets  (10-20 mg total) by mouth every other day as needed for erectile dysfunction., Disp: 18 tablet, Rfl: 5  No Known Allergies  Objective:   BP 118/68   Pulse 70   Temp 98.2 F (36.8 C)   Resp 18   Ht 5' 8 (1.727 m)   Wt 150 lb (68 kg)   BMI 22.81 kg/m    Physical Exam Vitals reviewed.  Constitutional:      General: He is not in acute distress.    Appearance: Normal appearance. He is not ill-appearing, toxic-appearing or diaphoretic.  HENT:     Head: Normocephalic and atraumatic.     Right Ear: Tympanic membrane, ear canal and external ear normal. There is no impacted cerumen.     Left Ear: Tympanic membrane, ear canal and external ear normal. There is impacted cerumen.  Eyes:     General: No scleral icterus.       Right eye: No discharge.        Left eye: No discharge.     Conjunctiva/sclera: Conjunctivae normal.  Cardiovascular:     Rate and Rhythm: Normal rate.  Pulmonary:     Effort: Pulmonary effort is normal. No respiratory distress.  Musculoskeletal:        General: Normal range of motion.     Cervical back: Normal range of motion.  Skin:    General: Skin is warm and dry.  Neurological:     Mental Status: He is alert and oriented to person, place, and time. Mental status is at baseline.  Psychiatric:        Mood and Affect: Mood normal.        Behavior: Behavior normal.        Thought Content: Thought content normal.        Judgment: Judgment normal.    Ear Cerumen Removal  Date/Time: 06/16/2024 8:23 PM  Performed by: Merlynn Niki FALCON, FNP Authorized by: Merlynn Niki FALCON, FNP   Anesthesia: Local Anesthetic: none Location details: left ear Patient tolerance: patient tolerated the procedure well with no immediate complications Procedure type: irrigation  Sedation: Patient sedated: no

## 2024-06-19 DIAGNOSIS — M9902 Segmental and somatic dysfunction of thoracic region: Secondary | ICD-10-CM | POA: Diagnosis not present

## 2024-06-19 DIAGNOSIS — M62830 Muscle spasm of back: Secondary | ICD-10-CM | POA: Diagnosis not present

## 2024-06-19 DIAGNOSIS — M9903 Segmental and somatic dysfunction of lumbar region: Secondary | ICD-10-CM | POA: Diagnosis not present

## 2024-06-19 DIAGNOSIS — M9901 Segmental and somatic dysfunction of cervical region: Secondary | ICD-10-CM | POA: Diagnosis not present

## 2024-07-03 DIAGNOSIS — M9903 Segmental and somatic dysfunction of lumbar region: Secondary | ICD-10-CM | POA: Diagnosis not present

## 2024-07-03 DIAGNOSIS — M545 Low back pain, unspecified: Secondary | ICD-10-CM | POA: Diagnosis not present

## 2024-07-03 DIAGNOSIS — M9901 Segmental and somatic dysfunction of cervical region: Secondary | ICD-10-CM | POA: Diagnosis not present

## 2024-07-03 DIAGNOSIS — M9906 Segmental and somatic dysfunction of lower extremity: Secondary | ICD-10-CM | POA: Diagnosis not present

## 2024-07-03 DIAGNOSIS — M9905 Segmental and somatic dysfunction of pelvic region: Secondary | ICD-10-CM | POA: Diagnosis not present

## 2024-07-03 DIAGNOSIS — M9902 Segmental and somatic dysfunction of thoracic region: Secondary | ICD-10-CM | POA: Diagnosis not present

## 2024-07-03 DIAGNOSIS — M62830 Muscle spasm of back: Secondary | ICD-10-CM | POA: Diagnosis not present

## 2024-07-03 DIAGNOSIS — M9904 Segmental and somatic dysfunction of sacral region: Secondary | ICD-10-CM | POA: Diagnosis not present

## 2024-07-17 DIAGNOSIS — M9902 Segmental and somatic dysfunction of thoracic region: Secondary | ICD-10-CM | POA: Diagnosis not present

## 2024-07-17 DIAGNOSIS — M545 Low back pain, unspecified: Secondary | ICD-10-CM | POA: Diagnosis not present

## 2024-07-17 DIAGNOSIS — M9906 Segmental and somatic dysfunction of lower extremity: Secondary | ICD-10-CM | POA: Diagnosis not present

## 2024-07-17 DIAGNOSIS — M9901 Segmental and somatic dysfunction of cervical region: Secondary | ICD-10-CM | POA: Diagnosis not present

## 2024-07-17 DIAGNOSIS — M62830 Muscle spasm of back: Secondary | ICD-10-CM | POA: Diagnosis not present

## 2024-07-17 DIAGNOSIS — M9903 Segmental and somatic dysfunction of lumbar region: Secondary | ICD-10-CM | POA: Diagnosis not present

## 2024-07-17 DIAGNOSIS — M9904 Segmental and somatic dysfunction of sacral region: Secondary | ICD-10-CM | POA: Diagnosis not present

## 2024-07-17 DIAGNOSIS — M9905 Segmental and somatic dysfunction of pelvic region: Secondary | ICD-10-CM | POA: Diagnosis not present

## 2024-07-28 DIAGNOSIS — M9906 Segmental and somatic dysfunction of lower extremity: Secondary | ICD-10-CM | POA: Diagnosis not present

## 2024-07-28 DIAGNOSIS — M545 Low back pain, unspecified: Secondary | ICD-10-CM | POA: Diagnosis not present

## 2024-07-28 DIAGNOSIS — M9901 Segmental and somatic dysfunction of cervical region: Secondary | ICD-10-CM | POA: Diagnosis not present

## 2024-07-28 DIAGNOSIS — M9902 Segmental and somatic dysfunction of thoracic region: Secondary | ICD-10-CM | POA: Diagnosis not present

## 2024-07-28 DIAGNOSIS — M62830 Muscle spasm of back: Secondary | ICD-10-CM | POA: Diagnosis not present

## 2024-07-28 DIAGNOSIS — M9905 Segmental and somatic dysfunction of pelvic region: Secondary | ICD-10-CM | POA: Diagnosis not present

## 2024-07-28 DIAGNOSIS — M9904 Segmental and somatic dysfunction of sacral region: Secondary | ICD-10-CM | POA: Diagnosis not present

## 2024-07-28 DIAGNOSIS — M9903 Segmental and somatic dysfunction of lumbar region: Secondary | ICD-10-CM | POA: Diagnosis not present

## 2024-08-13 DIAGNOSIS — D225 Melanocytic nevi of trunk: Secondary | ICD-10-CM | POA: Diagnosis not present

## 2024-08-13 DIAGNOSIS — L718 Other rosacea: Secondary | ICD-10-CM | POA: Diagnosis not present

## 2024-08-13 DIAGNOSIS — D2239 Melanocytic nevi of other parts of face: Secondary | ICD-10-CM | POA: Diagnosis not present

## 2024-09-23 ENCOUNTER — Other Ambulatory Visit: Payer: Self-pay | Admitting: Internal Medicine
